# Patient Record
Sex: Male | Born: 1960 | Race: White | Hispanic: No | Marital: Single | Smoking: Current every day smoker
Health system: Southern US, Community
[De-identification: ages and names within clinical notes are randomized; demographics above are authoritative.]

---

## 2014-05-02 ENCOUNTER — Ambulatory Visit: Payer: Self-pay | Admitting: Family Medicine

## 2014-09-07 ENCOUNTER — Ambulatory Visit: Payer: Self-pay | Admitting: Family Medicine

## 2014-09-16 ENCOUNTER — Ambulatory Visit: Payer: Self-pay | Admitting: Family Medicine

## 2014-09-16 ENCOUNTER — Inpatient Hospital Stay: Payer: Self-pay | Admitting: Internal Medicine

## 2014-11-09 NOTE — H&P (Signed)
PATIENT NAME:  Harold Galvan, Harold Galvan MR#:  [id number] DATE OF BIRTH:  28-Feb-1961  DATE OF ADMISSION:  09/16/2014  ADDENDUM:   The patient smokes 1 pack per day and I counseled him again as to quitting, offered assistance and spent about 5 minutes on smoking cessation education.    ____________________________ Katha HammingSnehalatha Tawanna Funk, MD sk:at D: 09/16/2014 17:26:40 ET T: 09/16/2014 17:52:54 ET JOB#: [id number]  cc: Katha HammingSnehalatha Marshea Wisher, MD, <Dictator> Katha HammingSNEHALATHA Denym Rahimi MD ELECTRONICALLY SIGNED 09/16/2014 18:33
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
## 2015-12-27 ENCOUNTER — Emergency Department (HOSPITAL_COMMUNITY)
Admission: EM | Admit: 2015-12-27 | Discharge: 2015-12-27 | Disposition: A | Discharge status: Home / Self Care | Payer: Self-pay | Attending: Emergency Medicine | Admitting: Emergency Medicine

## 2015-12-27 ENCOUNTER — Encounter (HOSPITAL_COMMUNITY): Payer: Self-pay

## 2015-12-27 DIAGNOSIS — F17200 Nicotine dependence, unspecified, uncomplicated: Secondary | ICD-10-CM | POA: Insufficient documentation

## 2015-12-27 DIAGNOSIS — H6593 Unspecified nonsuppurative otitis media, bilateral: Secondary | ICD-10-CM

## 2015-12-27 DIAGNOSIS — K4090 Unilateral inguinal hernia, without obstruction or gangrene, not specified as recurrent: Secondary | ICD-10-CM | POA: Insufficient documentation

## 2015-12-27 DIAGNOSIS — H938X3 Other specified disorders of ear, bilateral: Secondary | ICD-10-CM | POA: Insufficient documentation

## 2015-12-27 MED ORDER — HYDROCODONE-ACETAMINOPHEN 5-325 MG PO TABS: 2.0000 | ORAL_TABLET | ORAL | Status: DC | PRN

## 2015-12-27 MED ORDER — HYDROCODONE-ACETAMINOPHEN 5-325 MG PO TABS
1.0000 | ORAL_TABLET | Freq: Once | ORAL | Status: AC
  Administered 2015-12-27: 1 via ORAL
  Filled 2015-12-27: qty 1

## 2015-12-27 MED ORDER — FLUTICASONE PROPIONATE 50 MCG/ACT NA SUSP: 2.0000 | Freq: Every day | NASAL | Status: DC

## 2015-12-27 MED ORDER — LORATADINE 10 MG PO TABS: 10.0000 mg | ORAL_TABLET | Freq: Every day | ORAL | Status: DC

## 2015-12-27 NOTE — ED Provider Notes (Signed)
CSN: [id number]     Arrival date & time 12/27/15  2001 History   First MD Initiated Contact with Patient 12/27/15 2031     Chief Complaint  Patient presents with  . Inguinal Hernia     (Consider location/radiation/quality/duration/timing/severity/associated sxs/prior Treatment) Patient is a 55 y.o. male presenting with abdominal pain. The history is provided by the patient.  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 Relieved by:  Lying down Exacerbated by: bending, twisting and bearing down. Ineffective treatments:  None tried Associated symptoms: no anorexia, no cough, no dysuria and no vomiting   Risk factors: no alcohol abuse     History reviewed. No pertinent past medical history. History reviewed. No pertinent past surgical history. History reviewed. No pertinent family history. Social History  Substance Use Topics  . Smoking status: Current Every Day Smoker  . Smokeless tobacco: None  . Alcohol Use: 3.6 oz/week    6 Cans of beer per week    Review of Systems  HENT:       Ringing the ears for months and persistent allergies  Respiratory: Negative for cough.   Gastrointestinal: Positive for abdominal pain. Negative for vomiting and anorexia.  Genitourinary: Negative for dysuria.  All other systems reviewed and are negative.     Allergies  Review of patient's allergies indicates not on file.  Home Medications   Prior to Admission medications   Not on File   BP 150/94 mmHg  Pulse 89  Temp(Src) 98 F (36.7 C) (Oral)  Resp 18  SpO2 97% Physical Exam  Constitutional: He is  oriented to person, place, and time. He appears well-developed and well-nourished. No distress.  HENT:  Head: Normocephalic and atraumatic.  Right Ear: A middle ear effusion is present.  Left Ear: A middle ear effusion is present.  Nose: Mucosal edema present.  Mouth/Throat: Oropharynx is clear and moist.  Eyes: Conjunctivae and EOM are normal. Pupils are equal, round, and reactive to light.  Neck: Normal range of motion. Neck supple.  Cardiovascular: Normal rate, regular rhythm and intact distal pulses.   No murmur heard. Pulmonary/Chest: Effort normal and breath sounds normal. No respiratory distress. He has no wheezes. He has no rales.  Abdominal: Soft. He exhibits no distension. There is no tenderness. There is no rebound and no guarding. A hernia is present. Hernia confirmed positive in the right inguinal area.  Genitourinary:     Musculoskeletal: Normal range of motion. He exhibits no edema or tenderness.  Neurological: He is alert and oriented to person, place, and time.  Skin: Skin is warm and dry. No rash noted. No erythema.  Psychiatric: He has a normal mood and affect. His behavior is normal.  Nursing note and vitals reviewed.   ED Course  Procedures (including critical care time) Labs Review Labs Reviewed - No data to display  Imaging Review No results found. I have personally reviewed and evaluated these images and lab results as part of my medical decision-making.   EKG Interpretation None      MDM   Final diagnoses:  Right inguinal hernia  Middle ear  effusion, bilateral    Patient is a 55 year old male presenting today with worsening right inguinal hernia. Patient states he had a hernia years ago but had not had any issues until the last few weeks. He has been doing manual labor and heavy lifting and started noticing severe pain in his right groin. It is worse with lifting bending and twisting. Patient has a reducible right inguinal hernia on exam without  evidence of incarceration. Discussed with him that he needs to avoid heavy lifting and follow-up with surgery for repair. However patient is currently homeless and does not have insurance. He was given information to health and wellness and also at the shelter there is a Child psychotherapist present.  Secondly is had chronic ringing in his ears for several months. He also suffers from allergies. On evaluation he has bilateral TM effusions which is most likely the cause. Recommended allergy medicine.    Gwyneth Sprout, MD 12/27/15 2242

## 2015-12-27 NOTE — ED Notes (Signed)
Pt complaining of R inguinal hernia. Pt states recent lifting and noticed increased swelling. Pt states pain with movement.

## 2015-12-27 NOTE — ED Notes (Signed)
Patient able to ambulate independently  

## 2015-12-27 NOTE — Discharge Instructions (Signed)

## 2016-04-15 ENCOUNTER — Encounter (HOSPITAL_COMMUNITY): Payer: Self-pay | Admitting: Behavioral Health

## 2016-04-15 NOTE — BH Assessment (Signed)
Assessment Note  Harold Galvan is a 55 y.o. male. The following is a copy of the transcript originally completed by this author:  Pt is a 55 year old male hwo presented to [facility] via EMS after an apparent accidental drug overdose.  Pt provided history.  Pt stated on on or about 04/14/16, he experienced an accidental heroin overdose and so was transported to the hospital.  "I'm a drug addict.  I'll use whatever is in front of me, as much as I can."  Pt's UDS indicated the presence of cocaine, amphetamines, and methamphetamines.  UDS not positive for opioids.  Pt endorsed recent use of crack cocaine, but could not recall amounts or age of first use for any drugs.  Pt also endorsed depressive symptoms, including suicidal ideation (hanging self or overdose on 120 tabs of Unisom) if he is unable to get off drugs, hopelessness and worthlessness, and isolation.  Pt endorsed one previous suicide attempt -- an overdose on 60 Unisom pills in December 2016.  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  Pt requested inpatient treatment for substance use and mood.  Pt reported that he was treated inpatient once before -- "It was [facility]."  He could not remember any specific location.  He stated that he does not have a psychiatrist or therapist, and he is not on any psychotropic medication.  During assessment, Pt presented as alert and oriented.  He had good eye contact and was cooperative in session.  Pt's demeanor was calm.  He reported mood as sad, and affect was congruent.  Pt endorsed suicidal ideation with plan and other depressive symptoms (see above).  He also endorsed ongoing use of substances (crack cocaine, amphetamines, methamphetamines, heroin,  alcohol).  Memory and concentration were intact.  Speech was normal in rate, rhythm, and volume.  Thought processes were within normal limits, and thought content was goal-oriented.  Insight and judgment were fair.  Impulse control was poor.  Consulted with L. Earlene Plater, NP, who recommended inpatient treatment.  Per [facility] RN, Pt accepted to [facility] [phone] at 2100.   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  No past surgical history on file.  Family History: No family history on file.  Social History:  reports that he has been smoking.  He does not have any smokeless tobacco history on file. He reports that he drinks about 3.6 oz of alcohol per week . He reports that he uses drugs, including Amphetamines, "Crack" cocaine, Methamphetamines, and Heroin.  Additional Social History:  Alcohol / Drug Use Pain Medications: See PTA Prescriptions: See PTA Over the Counter: See PTA History of alcohol / drug use?: Yes Substance #1 Name of Substance 1: Crack Cocaine 1 - Amount (size/oz): Varied 1 - Frequency: Whenever possible 1 - Duration: Ongoing 1 - Last Use / Amount: 04/14/16 Substance #2 Name of Substance 2: Amphetamines 2 - Amount (size/oz): Varied 2 - Frequency: Whenever possible 2 - Duration: ongoing 2 - Last Use / Amount: 04/14/16 Substance #3 Name of Substance 3: Methamphetamines 3 - Amount (size/oz): Varied 3 - Frequency: Whenever possible 3 - Duration: ongoing 3 - Last Use / Amount: 04/14/16  Substance #4 Name of Substance 4: Heroin (Per report; UDS not + for heroin) 4 - Last Use / Amount: Unsure  CIWA:   COWS:    Allergies: Not on File  Home Medications:  (Not in a hospital admission)  OB/GYN Status:  No LMP for male patient.  General Assessment Data Location of Assessment: [facility] TTS Assessment: Out of system Is  this a Tele or Face-to-Face Assessment?: Tele Assessment Is this an Initial Assessment or a Re-assessment for this encounter?: Initial Assessment Marital status: Divorced Is patient pregnant?: No Pregnancy Status: No Living Arrangements: Alone, Other (Comment), Parent (Homeless) Can pt return to current living arrangement?: Yes Admission Status: Voluntary Is patient capable of signing voluntary admission?: Yes Referral Source: Self/Family/Friend Insurance type: Self-pay  Medical Screening Exam [facility] Walk-in ONLY) Medical Exam completed: Yes  Crisis Care Plan Living Arrangements: Alone, Other (Comment), Parent (Homeless) Name of Psychiatrist: None Name of Therapist: None  Education Status Is patient currently in school?: No  Risk to self with the past 6 months Suicidal Ideation: Yes-Currently Present Has patient been a risk to self within the past 6 months prior to admission? : No Suicidal Intent: No Has patient had any suicidal intent within the past 6 months prior to admission? : No Is patient at risk for suicide?: Yes Suicidal Plan?: Yes-Currently Present Has patient had any suicidal plan within the past 6 months prior to admission? : No Specify Current Suicidal Plan: Hang self; od on 120 Unisom Access to Means: No What has been your use of drugs/alcohol within the last 12 months?: Crack cocaine, methamphetamine, amphetamine (Pt also endorsed heroin) Previous Attempts/Gestures: Yes How many times?: 1 Other Self Harm Risks: Drug use Triggers for Past Attempts: Unknown Intentional Self Injurious Behavior: None Family Suicide History: Unknown Recent stressful life event(s): Other (Comment), Job Loss (Unintentional overdose; laid off from work) Persecutory voices/beliefs?: No Depression: Yes Depression Symptoms: Despondent, Insomnia, Isolating, Feeling worthless/self pity Substance abuse history and/or treatment for substance abuse?: Yes Suicide prevention information given  to non-admitted patients: Not applicable  Risk to Others within the past 6 months Homicidal Ideation: No Does patient have any lifetime risk of violence toward others beyond the six months prior to admission? : No Thoughts of Harm to Others: No Current Homicidal Intent: No Current Homicidal Plan: No Access to Homicidal Means: No History of harm to others?: No Assessment of Violence: None Noted Does patient have access to weapons?: No Criminal Charges Pending?: No Does patient have a court date: No Is patient on probation?: No  Psychosis Hallucinations: None noted Delusions: None noted  Mental Status Report Appearance/Hygiene: In scrubs Eye Contact: Good Motor Activity: Unremarkable Speech: Logical/coherent Level of Consciousness: Alert Mood: Sad, Preoccupied Affect: Appropriate to circumstance Anxiety Level: None Thought Processes: Coherent, Relevant Judgement: Partial Orientation: Person, Situation, Time, Place Obsessive Compulsive Thoughts/Behaviors: None  Cognitive Functioning Concentration: Good Memory: Recent Intact, Remote Intact IQ: Average Insight: Fair Impulse Control: Poor Appetite: Fair Sleep: Decreased Vegetative Symptoms: None  ADLScreening [facility] Assessment Services) Patient's cognitive ability adequate to safely complete daily activities?: Yes Patient able to express need for assistance with ADLs?: Yes Independently performs ADLs?: Yes (appropriate for developmental age)  Prior Inpatient Therapy Prior Inpatient Therapy: Yes Prior Therapy Dates: December 2016 Prior Therapy Facilty/Provider(s): "[facility]" Reason for Treatment: SI  Prior Outpatient Therapy Prior Outpatient Therapy: Yes Prior Therapy Dates: Not sure Does patient have an ACCT team?: No Does patient have Intensive In-House Services?  : No Does patient have [employer]  services? : No Does patient have P4CC services?: No  ADL Screening (condition at time of admission) Patient's  cognitive ability adequate to safely complete daily activities?: Yes Is the patient deaf or have difficulty hearing?: No Does the patient have difficulty seeing, even when wearing glasses/contacts?: No Does the patient have difficulty concentrating, remembering, or making decisions?: No Patient able to express need for assistance with ADLs?: Yes Does the patient have difficulty dressing or bathing?: No Independently performs ADLs?: Yes (appropriate for developmental age) Does the patient have difficulty walking or climbing stairs?: No Weakness of Legs: None Weakness of Arms/Hands: None  Home Assistive Devices/Equipment Home Assistive Devices/Equipment: None  Therapy Consults (therapy consults require a physician order) PT Evaluation Needed: No OT Evalulation Needed: No SLP Evaluation Needed: No Abuse/Neglect Assessment (Assessment to be complete while patient is alone) Physical Abuse: Denies Verbal Abuse: Denies Sexual Abuse: Denies Exploitation of patient/patient's resources: Denies Self-Neglect: Yes, present (Comment) (Substance use) Values / Beliefs Cultural Requests During Hospitalization: None Spiritual Requests During Hospitalization: None Consults Spiritual Care Consult Needed: No Social Work Consult Needed: No Merchant navy officer (For Healthcare) Does patient have an advance directive?: No Would patient like information on creating an advanced directive?: No - patient declined information    Additional Information 1:1 In Past 12 Months?: No CIRT Risk: No Elopement Risk: No     Disposition:  Disposition Initial Assessment Completed for this Encounter: Yes Disposition of Patient: Inpatient treatment program Type of inpatient treatment program: Adult (Per Laban Emperor, NP, Pt meets inpt criteria)  On Site Evaluation by:   Reviewed with Physician:    Dorris Fetch Kurstin Dimarzo 04/15/2016 6:18 PM

## 2016-04-16 ENCOUNTER — Encounter (HOSPITAL_COMMUNITY): Payer: Self-pay | Admitting: *Deleted

## 2016-04-16 ENCOUNTER — Inpatient Hospital Stay (HOSPITAL_COMMUNITY)
Admission: AD | Admit: 2016-04-16 | Discharge: 2016-04-21 | DRG: 885 | Disposition: A | Discharge status: Home / Self Care | Payer: Federal, State, Local not specified - Other | Source: Intra-hospital | Attending: Psychiatry | Admitting: Psychiatry

## 2016-04-16 DIAGNOSIS — F1510 Other stimulant abuse, uncomplicated: Secondary | ICD-10-CM | POA: Diagnosis present

## 2016-04-16 DIAGNOSIS — F17200 Nicotine dependence, unspecified, uncomplicated: Secondary | ICD-10-CM | POA: Diagnosis present

## 2016-04-16 DIAGNOSIS — F332 Major depressive disorder, recurrent severe without psychotic features: Secondary | ICD-10-CM | POA: Diagnosis present

## 2016-04-16 DIAGNOSIS — F1410 Cocaine abuse, uncomplicated: Secondary | ICD-10-CM | POA: Diagnosis present

## 2016-04-16 DIAGNOSIS — F1994 Other psychoactive substance use, unspecified with psychoactive substance-induced mood disorder: Secondary | ICD-10-CM | POA: Diagnosis present

## 2016-04-16 DIAGNOSIS — Z590 Homelessness: Secondary | ICD-10-CM

## 2016-04-16 DIAGNOSIS — R45851 Suicidal ideations: Secondary | ICD-10-CM | POA: Diagnosis present

## 2016-04-16 DIAGNOSIS — M25539 Pain in unspecified wrist: Secondary | ICD-10-CM

## 2016-04-16 DIAGNOSIS — F411 Generalized anxiety disorder: Secondary | ICD-10-CM | POA: Diagnosis present

## 2016-04-16 DIAGNOSIS — F17210 Nicotine dependence, cigarettes, uncomplicated: Secondary | ICD-10-CM | POA: Diagnosis not present

## 2016-04-16 DIAGNOSIS — G4700 Insomnia, unspecified: Secondary | ICD-10-CM | POA: Diagnosis present

## 2016-04-16 DIAGNOSIS — Z79899 Other long term (current) drug therapy: Secondary | ICD-10-CM | POA: Diagnosis not present

## 2016-04-16 DIAGNOSIS — F1490 Cocaine use, unspecified, uncomplicated: Secondary | ICD-10-CM | POA: Diagnosis not present

## 2016-04-16 DIAGNOSIS — Z818 Family history of other mental and behavioral disorders: Secondary | ICD-10-CM

## 2016-04-16 DIAGNOSIS — F1010 Alcohol abuse, uncomplicated: Secondary | ICD-10-CM | POA: Diagnosis present

## 2016-04-16 DIAGNOSIS — Z915 Personal history of self-harm: Secondary | ICD-10-CM | POA: Diagnosis not present

## 2016-04-16 LAB — HEPATIC FUNCTION PANEL
ALT: 43 U/L (ref 17–63)
AST: 26 U/L (ref 15–41)
Albumin: 3.9 g/dL (ref 3.5–5.0)
Alkaline Phosphatase: 71 U/L (ref 38–126)
Bilirubin, Direct: 0.1 mg/dL (ref 0.1–0.5)
Indirect Bilirubin: 0.4 mg/dL (ref 0.3–0.9)
TOTAL PROTEIN: 7.1 g/dL (ref 6.5–8.1)
Total Bilirubin: 0.5 mg/dL (ref 0.3–1.2)

## 2016-04-16 LAB — COMPREHENSIVE METABOLIC PANEL
ALBUMIN: 3.9 g/dL (ref 3.5–5.0)
ALT: 42 U/L (ref 17–63)
ANION GAP: 7 (ref 5–15)
AST: 26 U/L (ref 15–41)
Alkaline Phosphatase: 70 U/L (ref 38–126)
BILIRUBIN TOTAL: 0.5 mg/dL (ref 0.3–1.2)
BUN: 14 mg/dL (ref 6–20)
CO2: 24 mmol/L (ref 22–32)
Calcium: 9.1 mg/dL (ref 8.9–10.3)
Chloride: 107 mmol/L (ref 101–111)
Creatinine, Ser: 0.88 mg/dL (ref 0.61–1.24)
GFR calc Af Amer: >60 mL/min (ref >60)
GLUCOSE: 157 mg/dL — AB (ref 65–99)
POTASSIUM: 4 mmol/L (ref 3.5–5.1)
Sodium: 138 mmol/L (ref 135–145)
TOTAL PROTEIN: 7 g/dL (ref 6.5–8.1)

## 2016-04-16 LAB — LIPID PANEL
CHOL/HDL RATIO: 5.3 ratio
Cholesterol: 171 mg/dL (ref 0–200)
HDL: 32 mg/dL — AB (ref >40)
LDL CALC: 111 mg/dL — AB (ref 0–99)
Triglycerides: 142 mg/dL (ref <150)
VLDL: 28 mg/dL (ref 0–40)

## 2016-04-16 MED ORDER — ADULT MULTIVITAMIN W/MINERALS CH
1.0000 | ORAL_TABLET | Freq: Every day | ORAL | Status: DC
  Administered 2016-04-16 – 2016-04-20 (×5): 1 via ORAL
  Filled 2016-04-16 (×9): qty 1

## 2016-04-16 MED ORDER — HYDROXYZINE HCL 25 MG PO TABS: 25.0000 mg | ORAL_TABLET | Freq: Three times a day (TID) | ORAL | Status: DC | PRN

## 2016-04-16 MED ORDER — HYDROXYZINE HCL 25 MG PO TABS: 25.0000 mg | ORAL_TABLET | Freq: Four times a day (QID) | ORAL | Status: AC | PRN

## 2016-04-16 MED ORDER — LOPERAMIDE HCL 2 MG PO CAPS: 2.0000 mg | ORAL_CAPSULE | ORAL | Status: AC | PRN

## 2016-04-16 MED ORDER — NICOTINE 21 MG/24HR TD PT24
21.0000 mg | MEDICATED_PATCH | Freq: Every day | TRANSDERMAL | Status: DC
  Administered 2016-04-16 – 2016-04-21 (×6): 21 mg via TRANSDERMAL
  Filled 2016-04-16 (×8): qty 1

## 2016-04-16 MED ORDER — MAGNESIUM HYDROXIDE 400 MG/5ML PO SUSP
30.0000 mL | Freq: Every day | ORAL | Status: DC | PRN
  Administered 2016-04-18 – 2016-04-19 (×2): 30 mL via ORAL
  Filled 2016-04-16 (×2): qty 30

## 2016-04-16 MED ORDER — LORAZEPAM 1 MG PO TABS
1.0000 mg | ORAL_TABLET | Freq: Four times a day (QID) | ORAL | Status: AC | PRN
  Administered 2016-04-16: 1 mg via ORAL
  Filled 2016-04-16: qty 1

## 2016-04-16 MED ORDER — THIAMINE HCL 100 MG/ML IJ SOLN
100.0000 mg | Freq: Once | INTRAMUSCULAR | Status: AC
  Administered 2016-04-16: 100 mg via INTRAMUSCULAR

## 2016-04-16 MED ORDER — TRAZODONE HCL 50 MG PO TABS
50.0000 mg | ORAL_TABLET | Freq: Every evening | ORAL | Status: DC | PRN
  Administered 2016-04-16 – 2016-04-18 (×5): 50 mg via ORAL
  Filled 2016-04-16 (×10): qty 1

## 2016-04-16 MED ORDER — VITAMIN B-1 100 MG PO TABS
100.0000 mg | ORAL_TABLET | Freq: Every day | ORAL | Status: DC
  Administered 2016-04-17 – 2016-04-20 (×4): 100 mg via ORAL
  Filled 2016-04-16 (×7): qty 1

## 2016-04-16 MED ORDER — ALUM & MAG HYDROXIDE-SIMETH 200-200-20 MG/5ML PO SUSP: 30.0000 mL | ORAL | Status: DC | PRN

## 2016-04-16 MED ORDER — ONDANSETRON 4 MG PO TBDP: 4.0000 mg | ORAL_TABLET | Freq: Four times a day (QID) | ORAL | Status: AC | PRN

## 2016-04-16 MED ORDER — ACETAMINOPHEN 325 MG PO TABS
650.0000 mg | ORAL_TABLET | Freq: Four times a day (QID) | ORAL | Status: DC | PRN
  Administered 2016-04-17: 650 mg via ORAL
  Filled 2016-04-16: qty 2

## 2016-04-16 NOTE — [facility] Suicide Risk Assessment (Addendum)
[facility]   Nursing information obtained from:  Patient Demographic factors:  Male, Divorced or widowed, Caucasian, Cardell PeachGay, lesbian, or bisexual orientation, Low socioeconomic status, Living alone, Unemployed Current Mental Status:  Suicidal ideation indicated by patient, Suicide plan, Self-harm thoughts, Self-harm behaviors Loss Factors:  Decrease in vocational status, Loss of significant relationship, Decline in physical health, Financial problems / change in socioeconomic status Historical Factors:  Prior suicide attempts, Impulsivity Risk Reduction Factors:  NA  Total Time spent with patient: 45 minutes Principal Problem: Substance induced mood disorder (HCC) Diagnosis:   Patient Active Problem List   Diagnosis Date Noted  . Substance induced mood disorder (HCC) [F19.94] 04/16/2016    Continued Clinical Symptoms:  Alcohol Use Disorder Identification Test Final Score (AUDIT): 10 The "Alcohol Use Disorders Identification Test", Guidelines for Use in Primary Care, Second Edition.  World Science writerHealth Organization [facility](WHO). Score between 0-7:  no or low risk or alcohol related problems. Score between 8-15:  moderate risk of alcohol related problems. Score between 16-19:  high risk of alcohol related problems. Score 20 or above:  warrants further diagnostic evaluation for alcohol dependence and treatment.   CLINICAL FACTORS:  Patient is a 55 year old man, who states he accidentally overdosed on heroin . He denies any suicidal intention related to his overdose, but on assessment did describe depression and suicidal ideations. States he has been living in an apartment, area where there are drugs readily available, and that over recent weeks he has been abusing drugs regularly. States that heroin/opiates are not his substance of choice , and that he had used heroin only 1-2 times before. He does report history of polysubstance abuse , and describes history of alcohol, cocaine,  and amphetamine abuse . He had been drinking 5-6 beers a day , although not daily  . At this time not presenting with any alcohol withdrawal symptoms. He states recent months, particularly after being laid off from work and breaking up with his SO, have been " very tough", and that his substance abuse has increased . At this time patient is not presenting with significant opiate WDL symptoms. He reports feeling depressed, but denies current suicidal ideations  Dx- Polysubstance Abuse, Substance Induced Mood Disorder, versus MDD  Plan - Inpatient admission , Vistaril PRNs for anxiety, Trazodone PRNs for insomnia. Monitor mood and consider antidepressant medication if depression does not improve promptly with sobriety, abstinence. Although not currently presenting with any alcohol WDL , will order Ativan PRNs for potential emerging WDL symptoms. At this time patient is future oriented, focuses on disposition planning, states he cannot return to place where he was living, expresses interest in rehab settings   Musculoskeletal: Strength & Muscle Tone: within normal limits- no tremors, no diaphoresis, no psychomotor agitation noted  Gait & Station: normal Patient leans: N/A  Psychiatric Specialty Exam: Physical Exam  ROS denies headache, no chest pain, no shortness of breath, no vomiting   Blood pressure 98/60, pulse 75, temperature 97.6 F (36.4 C), resp. rate 20, height 5' 4.17" (1.63 m), weight 145 lb (65.8 kg), SpO2 97 %.Body mass index is 24.76 kg/m.  General Appearance: Fairly Groomed  Eye Contact:  Good  Speech:  Normal Rate  Volume:  Normal  Mood:  reports some depression , but states feeling better today  Affect:  vaguely anxious, reactive   Thought Process:  Linear  Orientation:  Other:  fully alert, attentive   Thought Content:  no hallucinations, no delusions, not internally preoccupied  Suicidal Thoughts:  No denies any suicidal or self injurious ideations at this time, and  contracts for safety on the unit  Homicidal Thoughts:  No denies any homicidal ideations   Memory:  recent and remote grossly intact   Judgement:  Fair  Insight:  Fair  Psychomotor Activity:  Normal- no current psychomotor agitation or restlessness   Concentration:  Concentration: Good and Attention Span: Good  Recall:  Good  Fund of Knowledge:  Good  Language:  Good  Akathisia:  Negative  Handed:  Right  AIMS (if indicated):     Assets:  Desire for Improvement Resilience  ADL's:  Intact  Cognition:  WNL  Sleep:  Number of Hours: 3.5      COGNITIVE FEATURES THAT CONTRIBUTE TO RISK:  Closed-mindedness and Loss of executive function    SUICIDE RISK:   Moderate:  Frequent suicidal ideation with limited intensity, and duration, some specificity in terms of plans, no associated intent, good self-control, limited dysphoria/symptomatology, some risk factors present, and identifiable protective factors, including available and accessible social support.   PLAN OF CARE: Patient will be admitted to inpatient psychiatric unit for stabilization and safety. Will provide and encourage milieu participation. Provide medication management and maked adjustments as needed.  Will follow daily.    I certify that inpatient services furnished can reasonably be expected to improve the patient's condition.  Nehemiah Massed, MD 04/16/2016, 5:20 PM

## 2016-04-16 NOTE — H&P (Signed)
Psychiatric Admission Assessment Adult  Patient Identification: Harold Galvan MRN:  [id number] Date of Evaluation:  04/16/2016 Chief Complaint:  MDD Severe Polysubstance Abuse Principal Diagnosis: Substance induced mood disorder ([place]) Diagnosis:   Patient Active Problem List   Diagnosis Date Noted  . Substance induced mood disorder [facility]) [F19.94] 04/16/2016   History of Present Illness:Per assessment note- Harold Galvan is a 55 y.o. male. The following is a copy of the transcript originally completed by this author: Pt is a 55 year old male hwo presented to [facility] via EMS after an apparent accidental drug overdose.  Pt provided history.  Pt stated on on or about 04/14/16, he experienced an accidental heroin overdose and so was transported to the hospital.  "I'm a drug addict.  I'll use whatever is in front of me, as much as I can."  Pt's UDS indicated the presence of cocaine, amphetamines, and methamphetamines.  UDS not positive for opioids.  Pt endorsed recent use of crack cocaine, but could not recall amounts or age of first use for any drugs.  Pt also endorsed depressive symptoms, including suicidal ideation (hanging self or overdose on 120 tabs of Unisom) if he is unable to get off drugs, hopelessness and worthlessness, and isolation.  Pt endorsed one previous suicide attempt -- an overdose on 70 Unisom pills in December 2016.  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  Pt requested inpatient treatment for substance use and mood. Pt reported that he was treated inpatient once before -- "It was [facility]."  He could not remember any specific location.  He stated that he does not have a psychiatrist or therapist, and he is not  on any psychotropic medication. During assessment, Pt presented as alert and oriented.  He had good eye contact and was cooperative in session.  Pt's demeanor was calm.  He reported mood as sad, and affect was congruent.  Pt endorsed suicidal ideation with plan and other depressive symptoms (see above).  He also endorsed ongoing use of substances (crack cocaine, amphetamines, methamphetamines, heroin, alcohol).  Memory and concentration were intact.  Speech was normal in rate, rhythm, and volume.  Thought processes were within normal limits, and thought content was goal-oriented.  Insight and judgment were fair.  Impulse control was poor.  Associated Signs/Symptoms: Depression Symptoms:  depressed mood, fatigue, suicidal attempt, (Hypo) Manic Symptoms:  Distractibility, Irritable Mood, Anxiety Symptoms:  Excessive Worry, Psychotic Symptoms:  Hallucinations: None PTSD Symptoms: Avoidance:  Decreased Interest/Participation Total Time spent with patient: 45 minutes  Past Psychiatric History: See above  Is the patient at risk to self? Yes.    Has the patient been a risk to self in the past 6 months? Yes.    Has the patient been a risk to self within the distant past? Yes.    Is the patient a risk to others? Yes.    Has the patient been a risk to others in the past 6 months? No.  Has the patient been a risk to others within the distant past? No.   Prior Inpatient Therapy: Prior Inpatient Therapy: Yes Prior Therapy Dates: December 2016 Prior Therapy Facilty/Provider(s): "[facility]" Reason for Treatment: SI Prior Outpatient Therapy: Prior Outpatient Therapy: Yes Prior Therapy Dates: Not sure Does patient have  an ACCT team?: No Does patient have Intensive In-House Services?  : No Does patient have [facility] services? : No Does patient have P4CC services?: No  Alcohol Screening: 1. How often do you have a drink containing alcohol?: 4 or more times a week 2. How many drinks containing  alcohol do you have on a typical day when you are drinking?: 1 or 2 3. How often do you have six or more drinks on one occasion?: Weekly Preliminary Score: 3 4. How often during the last year have you found that you were not able to stop drinking once you had started?: Never 5. How often during the last year have you failed to do what was normally expected from you becasue of drinking?: Never 6. How often during the last year have you needed a first drink in the morning to get yourself going after a heavy drinking session?: Never 7. How often during the last year have you had a feeling of guilt of remorse after drinking?: Never 8. How often during the last year have you been unable to remember what happened the night before because you had been drinking?: Less than monthly 9. Have you or someone else been injured as a result of your drinking?: No 10. Has a relative or friend or a doctor or another health worker been concerned about your drinking or suggested you cut down?: Yes, but not in the last year Alcohol Use Disorder Identification Test Final Score (AUDIT): 10 Brief Intervention: Yes Substance Abuse History in the last 12 months:  Yes.   Consequences of Substance Abuse: Withdrawal Symptoms:   Cramps Headaches Previous Psychotropic Medications: YES Psychological Evaluations: YES Past Medical History: History reviewed. No pertinent past medical history. History reviewed. No pertinent surgical history. Family History: History reviewed. No pertinent family history. Family Psychiatric  History: See Above Tobacco Screening: Have you used any form of tobacco in the last 30 days? (Cigarettes, Smokeless Tobacco, Cigars, and/or Pipes): Yes Tobacco use, Select all that apply: 5 or more cigarettes per day Are you interested in Tobacco Cessation Medications?: Yes, will notify MD for an order Counseled patient on smoking cessation including recognizing danger situations, developing coping skills and  basic information about quitting provided: Yes Social History:  History  Alcohol Use  . 3.6 oz/week  . 6 Cans of beer per week     History  Drug Use  . Types: Amphetamines, "Crack" cocaine, Methamphetamines, Heroin    Comment: Heroin use per report; UDS + coc, meth, amph    Additional Social History: Marital status: Divorced Are you sexually active?: No What is your sexual orientation?: heterosexual Has your sexual activity been affected by drugs, alcohol, medication, or emotional stress?: "probably" Does patient have children?: Yes How many children?: 1 How is patient's relationship with their children?: Estranged from 70 YO daughter due to pt's current lifestyle choices    Pain Medications: See PTA Prescriptions: See PTA Over the Counter: See PTA History of alcohol / drug use?: Yes Name of Substance 1: Crack Cocaine 1 - Amount (size/oz): Varied 1 - Frequency: Whenever possible 1 - Duration: Ongoing 1 - Last Use / Amount: 04/14/16 Name of Substance 2: Amphetamines 2 - Amount (size/oz): Varied 2 - Frequency: Whenever possible 2 - Duration: ongoing 2 - Last Use / Amount: 04/14/16 Name of Substance 3: Methamphetamines 3 - Amount (size/oz): Varied 3 - Frequency: Whenever possible 3 - Duration: ongoing 3 - Last Use / Amount: 04/14/16 Name of Substance 4: Heroin (Per report; UDS  not + for heroin) 4 - Last Use / Amount: Unsure            Allergies:  No Known Allergies Lab Results:  Results for orders placed or performed during the hospital encounter of 04/16/16 (from the past 48 hour(s))  Comprehensive metabolic panel     Status: Abnormal   Collection Time: 04/16/16  6:43 AM  Result Value Ref Range   Sodium 138 135 - 145 mmol/L   Potassium 4.0 3.5 - 5.1 mmol/L   Chloride 107 101 - 111 mmol/L   CO2 24 22 - 32 mmol/L   Glucose, Bld 157 (H) 65 - 99 mg/dL   BUN 14 6 - 20 mg/dL   Creatinine, Ser 0.88 0.61 - 1.24 mg/dL   Calcium 9.1 8.9 - 10.3 mg/dL   Total Protein 7.0  6.5 - 8.1 g/dL   Albumin 3.9 3.5 - 5.0 g/dL   AST 26 15 - 41 U/L   ALT 42 17 - 63 U/L   Alkaline Phosphatase 70 38 - 126 U/L   Total Bilirubin 0.5 0.3 - 1.2 mg/dL   GFR calc non Af Amer >60 >60 mL/min   GFR calc Af Amer >60 >60 mL/min    Comment: (NOTE) The eGFR has been calculated using the CKD EPI equation. This calculation has not been validated in all clinical situations. eGFR's persistently <60 mL/min signify possible Chronic Kidney Disease.    Anion gap 7 5 - 15    Comment: Performed at [facility]  Hepatic function panel     Status: None   Collection Time: 04/16/16  6:43 AM  Result Value Ref Range   Total Protein 7.1 6.5 - 8.1 g/dL   Albumin 3.9 3.5 - 5.0 g/dL   AST 26 15 - 41 U/L   ALT 43 17 - 63 U/L   Alkaline Phosphatase 71 38 - 126 U/L   Total Bilirubin 0.5 0.3 - 1.2 mg/dL   Bilirubin, Direct 0.1 0.1 - 0.5 mg/dL   Indirect Bilirubin 0.4 0.3 - 0.9 mg/dL    Comment: Performed at [facility]  Lipid panel     Status: Abnormal   Collection Time: 04/16/16  6:43 AM  Result Value Ref Range   Cholesterol 171 0 - 200 mg/dL   Triglycerides 142 <150 mg/dL   HDL 32 (L) >40 mg/dL   Total CHOL/HDL Ratio 5.3 RATIO   VLDL 28 0 - 40 mg/dL   LDL Cholesterol 111 (H) 0 - 99 mg/dL    Comment:        Total Cholesterol/HDL:CHD Risk Coronary Heart Disease Risk Table                     Men   Women  1/2 Average Risk   3.4   3.3  Average Risk       5.0   4.4  2 X Average Risk   9.6   7.1  3 X Average Risk  23.4   11.0        Use the calculated Patient Ratio above and the CHD Risk Table to determine the patient's CHD Risk.        ATP III CLASSIFICATION (LDL):  <100     mg/dL   Optimal  100-129  mg/dL   Near or Above                    Optimal  130-159  mg/dL   Borderline  160-189  mg/dL   High  >190     mg/dL   Very High Performed at [facility]     Blood Alcohol level:  No results found for: [facility]  Metabolic Disorder Labs:   No results found for: HGBA1C, MPG No results found for: PROLACTIN Lab Results  Component Value Date   CHOL 171 04/16/2016   TRIG 142 04/16/2016   HDL 32 (L) 04/16/2016   CHOLHDL 5.3 04/16/2016   VLDL 28 04/16/2016   LDLCALC 111 (H) 04/16/2016    Current Medications: Current Facility-Administered Medications  Medication Dose Route Frequency Provider Last Rate Last Dose  . acetaminophen (TYLENOL) tablet 650 mg  650 mg Oral Q6H PRN Rozetta Nunnery, NP      . alum & mag hydroxide-simeth (MAALOX/MYLANTA) 200-200-20 MG/5ML suspension 30 mL  30 mL Oral Q4H PRN Rozetta Nunnery, NP      . hydrOXYzine (ATARAX/VISTARIL) tablet 25 mg  25 mg Oral TID PRN Rozetta Nunnery, NP      . magnesium hydroxide (MILK OF MAGNESIA) suspension 30 mL  30 mL Oral Daily PRN Rozetta Nunnery, NP      . nicotine (NICODERM CQ - dosed in mg/24 hours) patch 21 mg  21 mg Transdermal Daily Rozetta Nunnery, NP   21 mg at 04/16/16 0911  . traZODone (DESYREL) tablet 50 mg  50 mg Oral QHS,MR X 1 Rozetta Nunnery, NP   50 mg at 04/16/16 0153   PTA Medications: Prescriptions Prior to Admission  Medication Sig Dispense Refill Last Dose  . acetaminophen (TYLENOL) 325 MG tablet Take 650 mg by mouth every 6 (six) hours as needed for mild pain or headache.     . fluticasone (FLONASE) 50 MCG/ACT nasal spray Place 2 sprays into both nostrils daily. 16 g 2   . HYDROcodone-acetaminophen (NORCO/VICODIN) 5-325 MG tablet Take 2 tablets by mouth every 4 (four) hours as needed. 6 tablet 0   . loratadine (CLARITIN) 10 MG tablet Take 1 tablet (10 mg total) by mouth daily. 30 tablet 1     Musculoskeletal: Strength & Muscle Tone: within normal limits Gait & Station: normal Patient leans: N/A  Psychiatric Specialty Exam: Physical Exam  Nursing note and vitals reviewed. Constitutional: He is oriented to person, place, and time. He appears well-developed and well-nourished.  Neurological: He is alert and oriented to person, place, and time.   Psychiatric: He has a normal mood and affect. His behavior is normal.    Review of Systems  Psychiatric/Behavioral: Positive for depression and substance abuse. The patient is nervous/anxious.     Blood pressure 98/60, pulse 75, temperature 97.6 F (36.4 C), resp. rate 20, height 5' 4.17" (1.63 m), weight 65.8 kg (145 lb), SpO2 97 %.Body mass index is 24.76 kg/m.  General Appearance: Disheveled  Eye Contact:  Minimal  Speech:  Clear and Coherent  Volume:  Normal  Mood:  Anxious and Depressed  Affect:  Depressed and Flat  Thought Process:  Coherent  Orientation:  Full (Time, Place, and Person)  Thought Content:  Hallucinations: None  Suicidal Thoughts:  No  Homicidal Thoughts:  No  Memory:  Immediate;   Fair Recent;   Fair Remote;   Fair  Judgement:  Intact  Insight:  Lacking  Psychomotor Activity:  Restlessness  Concentration:  Concentration: Good  Recall:  [place] of Knowledge:  Fair  Language:  Poor  Akathisia:  No  Handed:  Right  AIMS (if indicated):  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  Treatment Plan Summary: Daily contact with patient to assess and evaluate symptoms and progress in treatment and Medication management   Continue with for mood stabilization. Continue with Trazodone 50 mg for insomnia  Will continue to monitor vitals ,medication compliance and treatment side effects while patient is here.  Reviewed labs Glucose 157 elevated ,BAL - 0, UDS -. CSW will start working on disposition.  Patient to participate in therapeutic milieu  Observation Level/Precautions:  15 minute checks  Laboratory:   CBC Chemistry Profile UDS UA  Psychotherapy:  Individual and group session  Medications:  See above  Consultations:  Psychiatry   Discharge Concerns:  Safety, stabilization, and risk of access to medication and medication stabilization   Estimated LOS:5-7days  Other:     Physician Treatment Plan for Primary Diagnosis: Substance induced mood disorder ([place]) Long Term Goal(s): Improvement in symptoms so as ready for discharge  Short Term Goals: Ability to identify changes in lifestyle to reduce recurrence of condition will improve, Ability to demonstrate self-control will improve, Ability to maintain clinical measurements within normal limits will improve and Ability to identify triggers associated with substance abuse/mental health issues will improve  Physician Treatment Plan for Secondary Diagnosis: Principal Problem:   Substance induced mood disorder ([place])  Long Term Goal(s): Improvement in symptoms so as ready for discharge  Short Term Goals: Ability to verbalize feelings will improve, Ability to demonstrate self-control will improve, Ability to maintain clinical measurements within normal limits will improve, Compliance with prescribed medications will improve and Ability to identify triggers associated with substance abuse/mental health issues will improve  I certify that inpatient services furnished can reasonably be expected to improve the patient's condition.    Derrill Center, NP 10/7/20173:23 PM   I have reviewed case with NP and have met with patient  Agree with NP assessment  Patient is a 55 year old man, who states he accidentally overdosed on heroin . He denies any suicidal intention related to his overdose, but on assessment did describe depression and suicidal ideations. States he has been living in an apartment, area where there are drugs readily available, and that over recent weeks he has been abusing drugs regularly. States that heroin/opiates are not his substance of  choice , and that he had used heroin only 1-2 times before. He does report history of polysubstance abuse , and describes history of alcohol, cocaine, and amphetamine abuse . He had been drinking 5-6 beers a day , although not daily  . At this time not presenting with any alcohol withdrawal symptoms. He states recent months, particularly after being laid off from work and breaking up with his SO, have been " very tough", and that his substance abuse has increased . At this time patient is not presenting with significant opiate WDL symptoms. He reports feeling depressed, but denies current suicidal ideations  Dx- Polysubstance Abuse, Substance Induced Mood Disorder, versus MDD  Plan - Inpatient admission , Vistaril PRNs for anxiety, Trazodone PRNs for insomnia. Monitor mood and consider antidepressant medication if depression does not improve promptly with sobriety, abstinence. Although not currently presenting with any alcohol WDL ,  will order Ativan PRNs for potential emerging WDL symptoms. At this time patient is future oriented, focuses on disposition planning, states he cannot return to place where he was living, expresses interest in rehab settings

## 2016-04-16 NOTE — Progress Notes (Addendum)
Patient ID: Graciella BeltonKevin Arthur Matos, male   DOB: January 02, 1961, 55 y.o.   MRN: [id number]    D: Pt has been very flat and depressed on the unit today. Pt was also very isolative, he remained in the bed most of the day. Pt reported on his self inventory sheet that his depression was a 5, his hopelessness was a 5, and his anxiety was a 6. Pt reported being negative SI/HI, no AH/VH noted. A: 15 min checks continued for patient safety. R: Pt safety maintained.

## 2016-04-16 NOTE — Progress Notes (Signed)
Pt was invited to attend group; however, pt was in bed sleeping. 
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
## 2016-04-16 NOTE — B[facility] Suicide Risk Assessment (Signed)
[facility] INPATIENT:  Family/Significant Other Suicide Prevention Education  Suicide Prevention Education:  Patient Refusal for Family/Significant Other Suicide Prevention Education: The patient Harold Galvan has refused to provide written consent for family/significant other to be provided Family/Significant Other Suicide Prevention Education during admission and/or prior to discharge.  Physician notified.  Writer provided suicide prevention education directly to patient; conversation included risk factors, warning signs and resources to contact for help. Mobile crisis services explained and  explanations given as to resources which will be included on patient's discharge paperwork.   [name] 04/16/2016, 2:00 PM

## 2016-04-16 NOTE — [facility] Group Notes (Signed)
[facility] Group Notes: (Clinical Social Work)   04/16/2016      Type of Therapy:  Group Therapy   Participation Level:  Did Not Attend despite MHT prompting   Alisha Bacus Grossman-Orr, LCSW 04/16/2016, 11:07 AM     

## 2016-04-16 NOTE — Progress Notes (Signed)
D: Pt denies SI/HI/AVH. Pt is pleasant and cooperative. Pt goal for today is to have positive thoughts. A: Pt was offered support and encouragement. Pt was given scheduled medications. Pt was encourage to attend groups. Q 15 minute checks were done for safety.  R: Pt is taking medication. Pt has no complaints.Pt receptive to treatment and safety maintained on unit.

## 2016-04-16 NOTE — Tx Team (Signed)
Initial Treatment Plan 04/16/2016 1:08 AM Harold BeltonKevin Arthur Patalano ZOX:[id number]    PATIENT STRESSORS: Financial difficulties Health problems Loss of vital relationships Marital or family conflict Occupational concerns Substance abuse   PATIENT STRENGTHS: Ability for insight Average or above average intelligence Communication skills Motivation for treatment/growth   PATIENT IDENTIFIED PROBLEMS: At risk for suicide  Substance Abuse  "Get off the drugs"  "Find stable situation, get back to work, get back on my feet"               DISCHARGE CRITERIA:  Ability to meet basic life and health needs Adequate post-discharge living arrangements Improved stabilization in mood, thinking, and/or behavior Medical problems require only outpatient monitoring Motivation to continue treatment in a less acute level of care Need for constant or close observation no longer present Withdrawal symptoms are absent or subacute and managed without 24-hour nursing intervention  PRELIMINARY DISCHARGE PLAN: Attend 12-step recovery group Outpatient therapy Placement in alternative living arrangements  PATIENT/FAMILY INVOLVEMENT: This treatment plan has been presented to and reviewed with the patient, Harold Galvan.  The patient and family have been given the opportunity to ask questions and make suggestions.  Carleene OverlieMiddleton, [name], RN 04/16/2016, 1:08 AM

## 2016-04-16 NOTE — Progress Notes (Signed)
Admission Note:  55 year old male who presents voluntary, in no acute distress, for the treatment of SI, Depression, and Substance Abuse. Patient reports an accidental overdose of Heroin prior to admission.  Patient appears flat and depressed. Patient was calm and cooperative with admission process. Patient presents with passive SI and contracts for safety upon admission. Patient denies AVH. Patient reports multiple stressors to include a breakup with his fiance in October of 2016, the lost of a relationship with his daughter and grandchildren, and the recent loss of a job.  Patient reports that he was previously living on a friends couch and reports being surrounded by drugs. Patient states "I was going to end up dead".  Patient verbalizes that he will not return to friends home on discharge and is looking for placement at a halfway house or rehab facility.  Patient states "If I can't get housing, I'll sleep underneath a bridge".  Patient reports prior suicide attempt in December 2016 by overdosing on 60 Unisom pills.  Patient reports SI with plan to overdose on 120 Unisom pills or hanging himself.  Patient reports drug use of "meth, crack cocaine, and heroin or whatever's around".  [facility], patient's plan is to "get off the drugs" and "find stable situation, get back to work, and get back on my feet".  Skin was assessed and found to be clear of any abnormal marks apart from redness on left arm and right hand. Patient searched and no contraband found, POC and unit policies explained and understanding verbalized. Consents obtained. Food and fluids offered and accepted. Patient had no additional questions or concerns.

## 2016-04-16 NOTE — Progress Notes (Signed)
[name] is seen OOB UAL on the 300 hall today..he tolerates this sell. He completes his daily assessment and on it he writes he denies SI today and he rates his depression, hopelessness and anxiety "5/5/6", respectively. He takes his meds as planned, he interacts appropriately with other patients and he has attended his mornign group with his Child psychotherapistsocial worker. R Safety is in place and will continue to foster therapeutic relationship.

## 2016-04-17 DIAGNOSIS — F332 Major depressive disorder, recurrent severe without psychotic features: Secondary | ICD-10-CM | POA: Diagnosis present

## 2016-04-17 DIAGNOSIS — F17210 Nicotine dependence, cigarettes, uncomplicated: Secondary | ICD-10-CM

## 2016-04-17 NOTE — Progress Notes (Signed)
Patient did not attend the evening speaker AA meeting. Pt was notified that group was beginning but remained in bed.   

## 2016-04-17 NOTE — [facility] Group Notes (Signed)
The focus of this group is to educate the patient on the purpose and policies of crisis stabilization and provide a format to answer questions about their admission.  The group details unit policies and expectations of patients while admitted.  Patient did not attend 0900 nurse education orientation group this morning.  Patient stayed in bed.   

## 2016-04-17 NOTE — B[facility] Group Notes (Signed)
[facility] Group Notes: (Clinical Social Work)   04/17/2016      Type of Therapy:  Group Therapy   Participation Level:  Did Not Attend despite MHT prompting   Kishon Garriga Grossman-Orr, LCSW 04/17/2016, 12:23 PM     

## 2016-04-17 NOTE — Plan of Care (Signed)
Problem: Safety: Goal: Periods of time without injury will increase Outcome: Progressing Patient has not engaged in self harm, denies SI.  Problem: Medication: Goal: Compliance with prescribed medication regimen will improve Outcome: Progressing Patient is med compliant.   

## 2016-04-17 NOTE — Progress Notes (Signed)
[facility] MD Progress Note  04/17/2016 2:11 PM Harold Galvan  MRN:  [id number] Subjective:  "I'm feeling a little bit better today, but still feeling bad overall"  Objective: Pt seen and chart reviewed. Pt is alert/oriented x4, calm, cooperative, and appropriate to situation. Pt denies suicidal/homicidal ideation and psychosis and does not appear to be responding to internal stimuli. Pt states that he slept poorly but slightly better than he did at home. 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 Pt feels some benefit from medications thus far and would like to continue the plan from yesterday to give the medications time to work.  Pt is considering the implementation of an antidepressant but would like to think about this and consult with [place] on a daily basis to continually evaluate this. Advised pt that he would benefit from such.   Principal Problem: MDD (major depressive disorder), recurrent severe, without psychosis ([place]) Diagnosis:   Patient Active Problem List   Diagnosis Date Noted  . MDD (major depressive disorder), recurrent severe, without psychosis ([place]) [F33.2] 04/17/2016    Priority: High  . Substance induced mood disorder [facility]) [F19.94] 04/16/2016    Priority: High   Total Time spent with patient: 25 minutes  Past Psychiatric History: ETOH, MDD< polysubstance abuse  Past Medical History: History reviewed. No pertinent past medical history. History reviewed. No pertinent surgical history. Family History: History reviewed. No pertinent family history. Family Psychiatric  History: MDD Social History:  History  Alcohol Use  . 3.6 oz/week  . 6 Cans of beer per week     History  Drug Use  . Types: Amphetamines, "Crack" cocaine, Methamphetamines, Heroin    Comment: Heroin use per report; UDS + coc, meth, amph     Social History   Social History  . Marital status: Single    Spouse name: N/A  . Number of children: N/A  . Years of education: N/A   Social History Main Topics  . Smoking status: Current Every Day Smoker  . Smokeless tobacco: Never Used  . Alcohol use 3.6 oz/week    6 Cans of beer per week  . Drug use:     Types: Amphetamines, "Crack" cocaine, Methamphetamines, Heroin     Comment: Heroin use per report; UDS + coc, meth, amph  . Sexual activity: Not Currently   Other Topics Concern  . None   Social History Narrative  . None   Additional Social History:    Pain Medications: See PTA Prescriptions: See PTA Over the Counter: See PTA History of alcohol / drug use?: Yes Name of Substance 1: Crack Cocaine 1 - Amount (size/oz): Varied 1 - Frequency: Whenever possible 1 - Duration: Ongoing 1 - Last Use / Amount: 04/14/16 Name of Substance 2: Amphetamines 2 - Amount (size/oz): Varied 2 - Frequency: Whenever possible 2 - Duration: ongoing 2 - Last Use / Amount: 04/14/16 Name of Substance 3: Methamphetamines 3 - Amount (size/oz): Varied 3 - Frequency: Whenever possible 3 - Duration: ongoing 3 - Last Use / Amount: 04/14/16 Name of Substance 4: Heroin (Per report; UDS not + for heroin) 4 - Last Use / Amount: Unsure            Sleep: Fair  Appetite:  Fair  Current Medications: Current Facility-Administered Medications  Medication Dose Route Frequency Provider Last Rate Last Dose  . acetaminophen (TYLENOL)  tablet 650 mg  650 mg Oral Q6H PRN Rozetta Nunnery, NP      . alum & mag hydroxide-simeth (MAALOX/MYLANTA) 200-200-20 MG/5ML suspension 30 mL  30 mL Oral Q4H PRN Rozetta Nunnery, NP      . hydrOXYzine (ATARAX/VISTARIL) tablet 25 mg  25 mg Oral Q6H PRN Jenne Campus, MD      . loperamide (IMODIUM) capsule 2-4 mg  2-4 mg Oral PRN Jenne Campus, MD      . LORazepam (ATIVAN) tablet 1 mg  1 mg Oral Q6H PRN Jenne Campus, MD   1 mg at 04/16/16 1818  . magnesium  hydroxide (MILK OF MAGNESIA) suspension 30 mL  30 mL Oral Daily PRN Rozetta Nunnery, NP      . multivitamin with minerals tablet 1 tablet  1 tablet Oral Daily Jenne Campus, MD   1 tablet at 04/17/16 0827  . nicotine (NICODERM CQ - dosed in mg/24 hours) patch 21 mg  21 mg Transdermal Daily Rozetta Nunnery, NP   21 mg at 04/17/16 0827  . ondansetron (ZOFRAN-ODT) disintegrating tablet 4 mg  4 mg Oral Q6H PRN Jenne Campus, MD      . thiamine (VITAMIN B-1) tablet 100 mg  100 mg Oral Daily Jenne Campus, MD   100 mg at 04/17/16 0827  . traZODone (DESYREL) tablet 50 mg  50 mg Oral QHS,MR X 1 Rozetta Nunnery, NP   50 mg at 04/16/16 2152    Lab Results:  Results for orders placed or performed during the hospital encounter of 04/16/16 (from the past 48 hour(s))  Comprehensive metabolic panel     Status: Abnormal   Collection Time: 04/16/16  6:43 AM  Result Value Ref Range   Sodium 138 135 - 145 mmol/L   Potassium 4.0 3.5 - 5.1 mmol/L   Chloride 107 101 - 111 mmol/L   CO2 24 22 - 32 mmol/L   Glucose, Bld 157 (H) 65 - 99 mg/dL   BUN 14 6 - 20 mg/dL   Creatinine, Ser 0.88 0.61 - 1.24 mg/dL   Calcium 9.1 8.9 - 10.3 mg/dL   Total Protein 7.0 6.5 - 8.1 g/dL   Albumin 3.9 3.5 - 5.0 g/dL   AST 26 15 - 41 U/L   ALT 42 17 - 63 U/L   Alkaline Phosphatase 70 38 - 126 U/L   Total Bilirubin 0.5 0.3 - 1.2 mg/dL   GFR calc non Af Amer >60 >60 mL/min   GFR calc Af Amer >60 >60 mL/min    Comment: (NOTE) The eGFR has been calculated using the CKD EPI equation. This calculation has not been validated in all clinical situations. eGFR's persistently <60 mL/min signify possible Chronic Kidney Disease.    Anion gap 7 5 - 15    Comment: Performed at [facility]  Hepatic function panel     Status: None   Collection Time: 04/16/16  6:43 AM  Result Value Ref Range   Total Protein 7.1 6.5 - 8.1 g/dL   Albumin 3.9 3.5 - 5.0 g/dL   AST 26 15 - 41 U/L   ALT 43 17 - 63 U/L   Alkaline  Phosphatase 71 38 - 126 U/L   Total Bilirubin 0.5 0.3 - 1.2 mg/dL   Bilirubin, Direct 0.1 0.1 - 0.5 mg/dL   Indirect Bilirubin 0.4 0.3 - 0.9 mg/dL    Comment: Performed at [facility]  Lipid panel  Status: Abnormal   Collection Time: 04/16/16  6:43 AM  Result Value Ref Range   Cholesterol 171 0 - 200 mg/dL   Triglycerides 142 <150 mg/dL   HDL 32 (L) >40 mg/dL   Total CHOL/HDL Ratio 5.3 RATIO   VLDL 28 0 - 40 mg/dL   LDL Cholesterol 111 (H) 0 - 99 mg/dL    Comment:        Total Cholesterol/HDL:CHD Risk Coronary Heart Disease Risk Table                     Men   Women  1/2 Average Risk   3.4   3.3  Average Risk       5.0   4.4  2 X Average Risk   9.6   7.1  3 X Average Risk  23.4   11.0        Use the calculated Patient Ratio above and the CHD Risk Table to determine the patient's CHD Risk.        ATP III CLASSIFICATION (LDL):  <100     mg/dL   Optimal  100-129  mg/dL   Near or Above                    Optimal  130-159  mg/dL   Borderline  160-189  mg/dL   High  >190     mg/dL   Very High Performed at [facility]     Blood Alcohol level:  No results found for: [facility]  Metabolic Disorder Labs: No results found for: HGBA1C, MPG No results found for: PROLACTIN Lab Results  Component Value Date   CHOL 171 04/16/2016   TRIG 142 04/16/2016   HDL 32 (L) 04/16/2016   CHOLHDL 5.3 04/16/2016   VLDL 28 04/16/2016   LDLCALC 111 (H) 04/16/2016    Physical Findings: AIMS: Facial and Oral Movements Muscles of Facial Expression: None, normal Lips and Perioral Area: None, normal Jaw: None, normal Tongue: None, normal,Extremity Movements Upper (arms, wrists, hands, fingers): None, normal Lower (legs, knees, ankles, toes): None, normal, Trunk Movements Neck, shoulders, hips: None, normal, Overall Severity Severity of abnormal movements (highest score from questions above): None, normal Incapacitation due to abnormal movements: None,  normal Patient's awareness of abnormal movements (rate only patient's report): No Awareness, Dental Status Current problems with teeth and/or dentures?: No Does patient usually wear dentures?: No  CIWA:  CIWA-Ar Total: 0 COWS:     Musculoskeletal: Strength & Muscle Tone: within normal limits Gait & Station: normal Patient leans: N/A  Psychiatric Specialty Exam: Physical Exam  Review of Systems  Psychiatric/Behavioral: Positive for depression and substance abuse. Negative for hallucinations and suicidal ideas. The patient is nervous/anxious and has insomnia.   All other systems reviewed and are negative.   Blood pressure 100/72, pulse 81, temperature 97.6 F (36.4 C), resp. rate 20, height 5' 4.17" (1.63 m), weight 65.8 kg (145 lb), SpO2 97 %.Body mass index is 24.76 kg/m.  General Appearance: Disheveled  Eye Contact:  Fair  Speech:  Clear and Coherent and Normal Rate  Volume:  Normal  Mood:  Depressed  Affect:  Appropriate, Congruent and Depressed  Thought Process:  Coherent, Goal Directed, Linear and Descriptions of Associations: Intact  Orientation:  Full (Time, Place, and Person)  Thought Content:  Symptoms, worries, concerns, outpatient followup, medication management  Suicidal Thoughts:  No  Homicidal Thoughts:  No  Memory:  Immediate;   Fair Recent;   Fair  Remote;   Fair  Judgement:  Fair  Insight:  Fair  Psychomotor Activity:  Normal  Concentration:  Concentration: Fair and Attention Span: Fair  Recall:  [employer] of Knowledge:  Fair  Language:  Fair  Akathisia:  No  Handed:    AIMS (if indicated):     Assets:  resilience  ADL's:  Intact  Cognition:  WNL  Sleep:  Number of Hours: 4.5   Treatment Plan Summary: MDD (major depressive disorder), recurrent severe, without psychosis ([place]) with polysubstance abuse, unstable, managed as below:  Medications:  -Continue Vistaril 66m q6h prn anxiety -Continue Ativan 121mpo q6h prn per CIWA protocol -Continue  Trazodone 5027mo qhs repeat x1 prn insomnia -Continue Nicotine patch  Labs:  -Reviewed CBC, CMP and all WDL aside from mildly elevated DLD at 111, but not urgent at this time. Glucose 157 and will continue to monitor.   WitBenjamine MolaNP 04/17/2016, 2:11 PM    Agree with NP progress note as above

## 2016-04-17 NOTE — Progress Notes (Signed)
D: Patient remains isolative to bed, room. Does not participate in milieu, unit programming. Spoke with patient 1:1 who forwards minimal information. Rates sleep fair, appetite good, energy low and concentration poor. Patient's affect flat, mood depressed. Minimal eye contacts. Rating her depression at an 8/10, hopelessness at a 7/10 and anxiety at a 6/10. States goal for today is to "be sober." Denies pain, physical problems. Denying withdrawal symptoms. CIWA is a "0" with VSS.    A: Medicated per orders, no prns needed or requested. Emotional support offered and self inventory reviewed. Strongly encouraged to come out of room, interact with others.  R: Patient verbalizes understanding. Patient denies SI/HI and remains safe on level III obs. Will continue to encourage participation.

## 2016-04-18 ENCOUNTER — Inpatient Hospital Stay (HOSPITAL_COMMUNITY)
Admission: AD | Admit: 2016-04-18 | Discharge: 2016-04-18 | Disposition: A | Discharge status: Home / Self Care | Payer: Federal, State, Local not specified - Other | Source: Intra-hospital | Attending: Psychiatry | Admitting: Psychiatry

## 2016-04-18 DIAGNOSIS — F1490 Cocaine use, unspecified, uncomplicated: Secondary | ICD-10-CM

## 2016-04-18 MED ORDER — MIRTAZAPINE 15 MG PO TBDP: 15.0000 mg | ORAL_TABLET | Freq: Every day | ORAL | Status: DC

## 2016-04-18 NOTE — Progress Notes (Signed)
Recreation Therapy Notes  Date: 04/18/16 Time: 0930 Location: [address] Group Room  Group Topic: Stress Management  Goal Area(s) Addresses:  Patient will verbalize importance of using healthy stress management.  Patient will identify positive emotions associated with healthy stress management.   Intervention: Guided Imagery  Activity :  Depression Imagery.  LRT introduced the technique of guided imagery to the patients.  LRT read a script allowing patients to participate in the activity.  Patients were to follow along as LRT read script.  Education:  Stress Management, Discharge Planning.   Education Outcome: Acknowledges edcuation/In group clarification offered/Needs additional education  Clinical Observations/Feedback: Pt did not attend group.     Amparo Donalson, LRT/CTRS         Musette Kisamore A 04/18/2016 12:00 PM 

## 2016-04-18 NOTE — Progress Notes (Signed)
D: Patient resting in bed, isolative. Spoke with patient 1:1. Rates sleep as poor, appetite as good, energy as low and concentration as poor. Patient's affect flat, mood depressed. Minimal eye contact. Rating depression at a 7/10, hopelessness and anxiety both at an 8/10. States goal for today is to "need plan, rehab - hope for future." Denies pain, physical problems.   A: Medicated per orders, no prns requested, needed. Emotional support offered and self inventory reviewed. Encouraged completion of Suicide Safety Plan. Discussed POC with MD, SW.   R: Patient verbalizes understanding of POC. Patient denies SI/HI and remains safe on level III obs.

## 2016-04-18 NOTE — Tx Team (Signed)
Interdisciplinary Treatment and Diagnostic Plan Update  04/18/2016 Time of Session: 9:30 AM Harold Galvan MRN: [id number]  Principal Diagnosis: MDD (major depressive disorder), recurrent severe, without psychosis (HCC)  Secondary Diagnoses: Principal Problem:   MDD (major depressive disorder), recurrent severe, without psychosis (HCC) Active Problems:   Substance induced mood disorder (HCC)   Current Medications:  Current Facility-Administered Medications  Medication Dose Route Frequency Provider Last Rate Last Dose  . acetaminophen (TYLENOL) tablet 650 mg  650 mg Oral Q6H PRN Jackelyn PolingJason A Berry, NP   650 mg at 04/17/16 2135  . alum & mag hydroxide-simeth (MAALOX/MYLANTA) 200-200-20 MG/5ML suspension 30 mL  30 mL Oral Q4H PRN Jackelyn PolingJason A Berry, NP      . hydrOXYzine (ATARAX/VISTARIL) tablet 25 mg  25 mg Oral Q6H PRN Craige CottaFernando A Cobos, MD      . loperamide (IMODIUM) capsule 2-4 mg  2-4 mg Oral PRN Craige CottaFernando A Cobos, MD      . LORazepam (ATIVAN) tablet 1 mg  1 mg Oral Q6H PRN Craige CottaFernando A Cobos, MD   1 mg at 04/16/16 1818  . magnesium hydroxide (MILK OF MAGNESIA) suspension 30 mL  30 mL Oral Daily PRN Jackelyn PolingJason A Berry, NP   30 mL at 04/18/16 0819  . multivitamin with minerals tablet 1 tablet  1 tablet Oral Daily Craige CottaFernando A Cobos, MD   1 tablet at 04/18/16 0818  . nicotine (NICODERM CQ - dosed in mg/24 hours) patch 21 mg  21 mg Transdermal Daily Jackelyn PolingJason A Berry, NP   21 mg at 04/18/16 0818  . ondansetron (ZOFRAN-ODT) disintegrating tablet 4 mg  4 mg Oral Q6H PRN Craige CottaFernando A Cobos, MD      . thiamine (VITAMIN B-1) tablet 100 mg  100 mg Oral Daily Craige CottaFernando A Cobos, MD   100 mg at 04/18/16 0818  . traZODone (DESYREL) tablet 50 mg  50 mg Oral QHS,MR X 1 Jackelyn PolingJason A Berry, NP   50 mg at 04/17/16 2258   PTA Medications: Prescriptions Prior to Admission  Medication Sig Dispense Refill Last Dose  . acetaminophen (TYLENOL) 325 MG tablet Take 650 mg by mouth every 6 (six) hours as needed for mild pain or  headache.     . fluticasone (FLONASE) 50 MCG/ACT nasal spray Place 2 sprays into both nostrils daily. 16 g 2   . HYDROcodone-acetaminophen (NORCO/VICODIN) 5-325 MG tablet Take 2 tablets by mouth every 4 (four) hours as needed. 6 tablet 0   . loratadine (CLARITIN) 10 MG tablet Take 1 tablet (10 mg total) by mouth daily. 30 tablet 1     Patient Stressors: Financial difficulties Health problems Loss of vital relationships Marital or family conflict Occupational concerns Substance abuse  Patient Strengths: Ability for insight Average or above average Chief Operating Officerintelligence Communication skills Motivation for treatment/growth  Treatment Modalities: Medication Management, Group therapy, Case management,  1 to 1 session with clinician, Psychoeducation, Recreational therapy.   Physician Treatment Plan for Primary Diagnosis: MDD (major depressive disorder), recurrent severe, without psychosis (HCC) Long Term Goal(s): Improvement in symptoms so as ready for discharge Improvement in symptoms so as ready for discharge   Short Term Goals: Ability to identify changes in lifestyle to reduce recurrence of condition will improve Ability to demonstrate self-control will improve Ability to maintain clinical measurements within normal limits will improve Ability to identify triggers associated with substance abuse/mental health issues will improve Ability to verbalize feelings will improve Ability to demonstrate self-control will improve Ability to maintain clinical measurements within normal limits  will improve Compliance with prescribed medications will improve Ability to identify triggers associated with substance abuse/mental health issues will improve  Medication Management: Evaluate patient's response, side effects, and tolerance of medication regimen.  Therapeutic Interventions: 1 to 1 sessions, Unit Group sessions and Medication administration.  Evaluation of Outcomes: Progressing  Physician  Treatment Plan for Secondary Diagnosis: Principal Problem:   MDD (major depressive disorder), recurrent severe, without psychosis (HCC) Active Problems:   Substance induced mood disorder (HCC)  Long Term Goal(s): Improvement in symptoms so as ready for discharge Improvement in symptoms so as ready for discharge   Short Term Goals: Ability to identify changes in lifestyle to reduce recurrence of condition will improve Ability to demonstrate self-control will improve Ability to maintain clinical measurements within normal limits will improve Ability to identify triggers associated with substance abuse/mental health issues will improve Ability to verbalize feelings will improve Ability to demonstrate self-control will improve Ability to maintain clinical measurements within normal limits will improve Compliance with prescribed medications will improve Ability to identify triggers associated with substance abuse/mental health issues will improve     Medication Management: Evaluate patient's response, side effects, and tolerance of medication regimen.  Therapeutic Interventions: 1 to 1 sessions, Unit Group sessions and Medication administration.  Evaluation of Outcomes: Progressing   RN Treatment Plan for Primary Diagnosis: MDD (major depressive disorder), recurrent severe, without psychosis (HCC) Long Term Goal(s): Knowledge of disease and therapeutic regimen to maintain health will improve  Short Term Goals: Ability to participate in decision making will improve and Ability to identify and develop effective coping behaviors will improve  Medication Management: RN will administer medications as ordered by provider, will assess and evaluate patient's response and provide education to patient for prescribed medication. RN will report any adverse and/or side effects to prescribing provider.  Therapeutic Interventions: 1 on 1 counseling sessions, Psychoeducation, Medication administration,  Evaluate responses to treatment, Monitor vital signs and CBGs as ordered, Perform/monitor CIWA, COWS, AIMS and Fall Risk screenings as ordered, Perform wound care treatments as ordered.  Evaluation of Outcomes: Progressing   LCSW Treatment Plan for Primary Diagnosis: MDD (major depressive disorder), recurrent severe, without psychosis (HCC) Long Term Goal(s): Safe transition to appropriate next level of care at discharge, Engage patient in therapeutic group addressing interpersonal concerns.  Short Term Goals: Engage patient in aftercare planning with referrals and resources, Increase ability to appropriately verbalize feelings and Identify triggers associated with mental health/substance abuse issues  Therapeutic Interventions: Assess for all discharge needs, 1 to 1 time with Social worker, Explore available resources and support systems, Assess for adequacy in community support network, Educate family and significant other(s) on suicide prevention, Complete Psychosocial Assessment, Interpersonal group therapy.  Evaluation of Outcomes: Progressing   Progress in Treatment: Attending groups: No. Participating in groups: No. Taking medication as prescribed: No. Toleration medication: No. Family/Significant other contact made: No, will contact:  patient declined consent for collateral contact Patient understands diagnosis: Yes. Discussing patient identified problems/goals with staff: Yes. Medical problems stabilized or resolved: Yes. Denies suicidal/homicidal ideation: No. and As evidenced by:  admitted after accidental overdose, past history of suicide attempts by overdose, current abuse of illicit drugs Issues/concerns per patient self-inventory: No. Other: na  New problem(s) identified: No, Describe:  none at this time  New Short Term/Long Term Goal(s): none at this time  Discharge Plan or Barriers: homeless, no supports, wants residential substance treatment, needs  referrals  Reason for Continuation of Hospitalization: Depression Medication stabilization Suicidal ideation  Estimated  Length of Stay: 3 - 5 days  Attendees: Patient: 04/18/2016 11:01 AM  Physician: Alycia Rossetti MD 04/18/2016 11:01 AM  Nursing: Clotilde Dieter RN 04/18/2016 11:01 AM  RN Care Manager: 04/18/2016 11:01 AM  Social Worker: Governor Rooks LCSW 04/18/2016 11:01 AM  Recreational Therapist:  04/18/2016 11:01 AM  Other:  04/18/2016 11:01 AM  Other:  04/18/2016 11:01 AM  Other: 04/18/2016 11:01 AM    Scribe for Treatment Team: Sallee Lange, LCSW 04/18/2016 11:01 AM

## 2016-04-18 NOTE — Plan of Care (Signed)
Problem: Activity: Goal: Sleeping patterns will improve Outcome: Not Progressing Patient reports poor sleep. Lies in bed throughout the day however states he does not rest.  Problem: Safety: Goal: Ability to remain free from injury will improve Outcome: Progressing Patient has not engaged in self harm.

## 2016-04-18 NOTE — Progress Notes (Signed)
Write spoke with patient 1:1 and he reports that his day has been a little rocky. He expresses interest in long term treatment because he reports that after he lost his job and unable to find work has been rough. He reports that he needs help with his drug habits and want to clean himself up. Support given and safety maintained on unit with 15 min checks.

## 2016-04-18 NOTE — Progress Notes (Signed)
[facility]  04/18/2016 2:52 PM  Patient Active Problem List   Diagnosis Date Noted  . MDD (major depressive disorder), recurrent severe, without psychosis (HCC) 04/17/2016  . Substance induced mood disorder (HCC) 04/16/2016    Diagnosis: Cocaine use disorder, severe  Subjective: "I need to get back to the person I was." Patient relates that he was doing excessive amounts of crack cocaine and this is his drug of "choice. He states that he lost his job which didn't pay very much anyway and was staying with someone who is a heavy user of illicit substances and he began using heavily as well. He does endorse using more than expected being unable to cut down, cravings, use despite consequences such as losing his fianc in October of last year, spending considerable time and energy in use, and giving up things for use. He reports that on 10/5 he tried what he thought was heroin and had an accidental overdose and had to go to the emergency room. He states he was released went home and smoked methamphetamine straight for 24 hours to return to the emergency room seeking help. He currently denies active suicidal or homicidal plans. He does endorse that he still feels dizzy when he stands up and there is pain in his right wrist when he rotates and abducts it. He cannot recall if he had fallen on it or head fallen asleep on it while using.  Objective: Well-developed well-nourished slightly disheveled man in no apparent distress who is pleasant and cooperative he does appear to be slightly unsteady on his feet otherwise speech is and motor within normal limits. Mood is described as all right, affect is mildly anxious and dysphoric. Thought processes linear and goal-directed, thought content denies acute suicidal or homicidal ideation, plan or intent denies psychosis alert and oriented 3 insight is limited judgment is limited IQ appears an average range         Current Facility-Administered  Medications (Analgesics):  .  acetaminophen (TYLENOL) tablet 650 mg     Current Facility-Administered Medications (Other):  .  alum & mag hydroxide-simeth (MAALOX/MYLANTA) 200-200-20 MG/5ML suspension 30 mL .  hydrOXYzine (ATARAX/VISTARIL) tablet 25 mg .  loperamide (IMODIUM) capsule 2-4 mg .  LORazepam (ATIVAN) tablet 1 mg .  magnesium hydroxide (MILK OF MAGNESIA) suspension 30 mL .  multivitamin with minerals tablet 1 tablet .  nicotine (NICODERM CQ - dosed in mg/24 hours) patch 21 mg .  ondansetron (ZOFRAN-ODT) disintegrating tablet 4 mg .  thiamine (VITAMIN B-1) tablet 100 mg .  traZODone (DESYREL) tablet 50 mg  No current outpatient prescriptions on file.  Vital Signs:Blood pressure 97/64, pulse 86, temperature 97.6 F (36.4 C), resp. rate 16, height 5' 4.17" (1.63 m), weight 65.8 kg (145 lb), SpO2 97 %.    Lab Results: No results found for this or any previous visit (from the past 48 hour(s)).  Physical Findings: AIMS: Facial and Oral Movements Muscles of Facial Expression: None, normal Lips and Perioral Area: None, normal Jaw: None, normal Tongue: None, normal,Extremity Movements Upper (arms, wrists, hands, fingers): None, normal Lower (legs, knees, ankles, toes): None, normal, Trunk Movements Neck, shoulders, hips: None, normal, Overall Severity Severity of abnormal movements (highest score from questions above): None, normal Incapacitation due to abnormal movements: None, normal Patient's awareness of abnormal movements (rate only patient's report): No Awareness, Dental Status Current problems with teeth and/or dentures?: No Does patient usually wear dentures?: No  CIWA:  CIWA-Ar Total: 2 COWS:  Assessment/Plan: Patient who is using cocaine and methamphetamine heavily. He reports he is not a usual user of opiates and is perhaps tried heroin 3 times in his life including the overdose which she says was accidental 110/5/17. He reports that he would very much  like to have treatment for his substance use disorders. Currently we will continue with trazodone when necessary and Ativan when necessary for see what greater than 10 and I will place him on falls precautions secondary to his complaints of unsteadiness and he did look slightly unsteady on his feet. Patient was educated that he is probably quite tired and dehydrated from his drug use and that he should continue to take in fluids. He complains of right risk pain that is new onset we will order a x-ray to evaluate. Social worker will work with him on options for longer term treatment for substance use disorder.  Acquanetta Sit, MD 04/18/2016, 2:52 PM

## 2016-04-18 NOTE — Plan of Care (Signed)
Problem: Medication: Goal: Compliance with prescribed medication regimen will improve Outcome: Progressing Patient was compliant with his scheduled medication.

## 2016-04-19 MED ORDER — HYDROXYZINE HCL 50 MG PO TABS
50.0000 mg | ORAL_TABLET | Freq: Every evening | ORAL | Status: DC | PRN
  Administered 2016-04-19: 50 mg via ORAL
  Filled 2016-04-19: qty 1
  Filled 2016-04-19: qty 7

## 2016-04-19 NOTE — [facility] Group Notes (Signed)
[facility] Group Notes:  (Nursing/MHT/Case Management/Adjunct)  Date:  04/19/2016  Time:  10:15 AM   Type of Therapy:  Psychoeducational Skills  Participation Level:  Active  Participation Quality:  Appropriate  Affect:  Appropriate  Cognitive:  Appropriate  Insight:  Appropriate  Engagement in Group:  Engaged  Modes of Intervention:  Problem-solving  Summary of Progress/Problems: Group encouraged to surround themselves with positive and healthy group/support system when changing to a healthy life style.    Harold PunchesJane O Naaman Galvan 04/19/2016, 10:15 AM

## 2016-04-19 NOTE — Progress Notes (Signed)
Patient attended AA group meeting tonight.  

## 2016-04-19 NOTE — Progress Notes (Signed)
[facility]  04/19/2016 1:07 PM  Patient Active Problem List   Diagnosis Date Noted  . MDD (major depressive disorder), recurrent severe, without psychosis (HCC) 04/17/2016  . Substance induced mood disorder (HCC) 04/16/2016    Diagnosis: Cocaine use disorder, severe  Subjective: Patient describes his mood as "I don't know." He denies any current suicidal or homicidal ideation, plan or intent. He is giving considerable thought to what his next step would be in pursuing his recovery. He would not like to return to work he was living before and we discussed temporizing options while he applies to perhaps a long-term program. He states he is not sure the trazodone has been effective for sleep and it also gave him very vivid dreams.  Objective: Well developed well nourished slightly disheveled white male in no apparent distress who is pleasant and cooperative mood is a bit anxious affect is congruent thought processes linear and goal directed thought content no current suicidal or homicidal ideation, plan or intent no psychosis speech and motor appear within normal limits alert and oriented 3 insight and judgment are fair IQ appears an average range        Current Facility-Administered Medications (Analgesics):  .  acetaminophen (TYLENOL) tablet 650 mg     Current Facility-Administered Medications (Other):  .  alum & mag hydroxide-simeth (MAALOX/MYLANTA) 200-200-20 MG/5ML suspension 30 mL .  hydrOXYzine (ATARAX/VISTARIL) tablet 25 mg .  hydrOXYzine (ATARAX/VISTARIL) tablet 50 mg .  loperamide (IMODIUM) capsule 2-4 mg .  LORazepam (ATIVAN) tablet 1 mg .  magnesium hydroxide (MILK OF MAGNESIA) suspension 30 mL .  multivitamin with minerals tablet 1 tablet .  nicotine (NICODERM CQ - dosed in mg/24 hours) patch 21 mg .  ondansetron (ZOFRAN-ODT) disintegrating tablet 4 mg .  thiamine (VITAMIN B-1) tablet 100 mg  No current outpatient prescriptions on file.  Vital  Signs:Blood pressure 92/70, pulse 90, temperature 98.4 F (36.9 C), temperature source Oral, resp. rate 16, height 5' 4.17" (1.63 m), weight 65.8 kg (145 lb), SpO2 97 %.    Lab Results: No results found for this or any previous visit (from the past 48 hour(s)).  Physical Findings: AIMS: Facial and Oral Movements Muscles of Facial Expression: None, normal Lips and Perioral Area: None, normal Jaw: None, normal Tongue: None, normal,Extremity Movements Upper (arms, wrists, hands, fingers): None, normal Lower (legs, knees, ankles, toes): None, normal, Trunk Movements Neck, shoulders, hips: None, normal, Overall Severity Severity of abnormal movements (highest score from questions above): None, normal Incapacitation due to abnormal movements: None, normal Patient's awareness of abnormal movements (rate only patient's report): No Awareness, Dental Status Current problems with teeth and/or dentures?: No Does patient usually wear dentures?: No  CIWA:  CIWA-Ar Total: 2 COWS:      Assessment/Plan: Patient appears to be successfully detoxified from any substances he ingested at this time. Plan is for him to work with social work to set up more longer term substance abuse treatment at this time.  Acquanetta SitElizabeth Woods Oates, MD 04/19/2016, 1:07 PM

## 2016-04-19 NOTE — Progress Notes (Signed)
D: Pt was in bed in his room upon initial approach.  Pt has depressed affect and mood.  His goal is to "stay alive and make it."  Pt forwards little information to Probation officer.  Pt denies SI/HI, denies hallucinations, denies pain.  He has stayed in his room for the majority of the night.    A: Introduced self to pt.  Met with pt and offered support and encouragement.  Medication administered per order.  Medication education provided.  R: Pt is compliant with medications.  Pt verbally contracts for safety.  Will continue to monitor and assess.

## 2016-04-19 NOTE — Plan of Care (Signed)
Problem: Education: Goal: Emotional status will improve Outcome: Progressing Patient reports decreased depressive symptoms.  He denies self harm thoughts.

## 2016-04-19 NOTE — Progress Notes (Signed)
Recreation Therapy Notes  Animal-Assisted Activity (AAA) Program Checklist/Progress Notes Patient Eligibility Criteria Checklist & Daily Group note for Rec TxIntervention  Date: 10.10.2017 Time: 3:00pm [facility] [address]    AAA/T Program Assumption of Risk Form signed by Patient/ or Parent Legal Guardian Yes  Patient is free of allergies or sever asthma Yes  Patient reports no fear of animals Yes  Patient reports no history of cruelty to animals Yes  Patient understands his/her participation is voluntary Yes  Behavioral Response: Did not attend  [employer] Specialty ChemicalsDenise L Reola Buckles, LRT/CTRS  Namine Beahm L 04/19/2016 3:11 PM

## 2016-04-19 NOTE — [facility] Group Notes (Signed)
Type of Therapy: Mental Health Association Presentation  Participation Level: Active  Participation Quality: Attentive  Affect: Appropriate  Cognitive: Oriented  Insight: Developing/Improving  Engagement in Therapy: Engaged  Modes of Intervention: Discussion, Education and Socialization  Summary of Progress/Problems: Mental Health Association (MHA) Speaker came to talk about his personal journey with substance abuse and addiction. The pt processed ways by which to relate to the speaker. MHA speaker provided handouts and educational information pertaining to groups and services offered by the MHA. Pt was engaged in speaker's presentation and was receptive to resources provided.  Elhadj Girton LCSW, MSW Clinical Social Work: [facility]  

## 2016-04-19 NOTE — Plan of Care (Signed)
Problem: Activity: Goal: Interest or engagement in activities will improve Outcome: Progressing Pt has been more visible in milieu tonight.  He attended evening group and has been more interactive with peers and staff.

## 2016-04-19 NOTE — Progress Notes (Signed)
D: Pt was in the day room upon initial approach.  Pt presents with depressed affect and mood.  He reports his goal is to "have a reasonably decent day, I think I have, I haven't been down, been positive."  He reports mild constipation.  Reports he had a small bowel movement yesterday.  Pt denies SI/HI, denies hallucinations, denies pain.  He expressed that he would like to discuss potentially going to long[facility].  Pt has been more visible in milieu tonight.  Pt attended evening group.    A: Actively listened to pt and offered support and encouragement.  PRN medication administered for constipation and sleep.  Encouraged pt to discuss aftercare options with Child psychotherapistsocial worker.    R: Pt is compliant with medications.  Pt verbally contracts for safety.  Will continue to monitor and assess.

## 2016-04-19 NOTE — Progress Notes (Signed)
Psychoeducational Group Note  Date:  04/19/2016 Time:  0006  Group Topic/Focus:  Wrap-Up Group:   The focus of this group is to help patients review their daily goal of treatment and discuss progress on daily workbooks.   Participation Level: Did Not Attend  Participation Quality:  Not Applicable  Affect:  Not Applicable  Cognitive:  Not Applicable  Insight:  Not Applicable  Engagement in Group: Not Applicable  Additional Comments:  The patient did not attend group last evening since he was asleep in his bedroom.    Harold Galvan 04/19/2016, 12:06 AM

## 2016-04-19 NOTE — Progress Notes (Signed)
D: Patient complains of poor sleep last night due to vivid dreams which he contributed to the trazodone he was given.  Patient continues to exhibit flat affect with depressed mood.  He rates his depression as a 5; hopelessness as a 6; anxiety as a 4.  He denies any withdrawal symptoms.  Patient denies any thoughts of self harm.  Patient has minimal interaction with staff and peers. A: Continue to monitor medication management and MD orders.  Safety checks completed every 15 minutes per protocol.  Offer support and encouragement as needed. R: Patient's behavior is appropriate.

## 2016-04-20 NOTE — Progress Notes (Signed)
Patient did not attend NA group meeting tonight.  

## 2016-04-20 NOTE — Plan of Care (Signed)
Problem: Education: Goal: Mental status will improve Outcome: Progressing Patient reports decreased depressive symptoms.

## 2016-04-20 NOTE — Progress Notes (Signed)
LCSW following and assisting with aftercare. Patient reports he cannot go back to [place]. He reports he would like long term treatment and LCSW discussed [facility] in which he is agreeable.  LCSW will also give patient information about [facility], [facility], And will [facility].  Patient encouraged to call and set up intake appointment and see if bed available. He [place] not qualify [place] [facility] [place].  Will continue to assist with aftercare plan.  Deretha EmoryHannah Keymiah Lyles LCSW, MSW Clinical Social Work: Optician, dispensingystem [employer]

## 2016-04-20 NOTE — Progress Notes (Signed)
D   Pt spent the majority of the shift in his bed   He has isolated and not participating in the unit activities   He denies suicidal ideation and refused sleep medication   He said he didn't need it A   Verbal support given   Medications offered   Q 15 min checks R   Pt safe at present

## 2016-04-20 NOTE — Progress Notes (Signed)
[facility] referral completed. Patient instructed to complete prescreen today at 3:45pm. Was told to call and complete, possible bed if prescreen goes well for Thursday.  Will follow up on Thursday morning.  Harold EmoryHannah Jaleeyah Munce LCSW, MSW Clinical Social Work: Optician, dispensingystem [employer]

## 2016-04-20 NOTE — Progress Notes (Signed)
Recreation Therapy Notes  Date: 04/20/16 Time: 0930 Location: 300 [address] Dayroom  Group Topic: Stress Management  Goal Area(s) Addresses:  Patient will verbalize importance of using healthy stress management.  Patient will identify positive emotions associated with healthy stress management.   Intervention: Stress Management  Activity :  Progressive Muscle Relaxation.  LRT introduced the technique of progressive muscle relaxation.  LRT explained that the technique allows patients to relax muscle groups one at a time.  LRT read a script to guide the patients through the technique.  Pt were to follow along as LRT read script to engage in technique.  Education:  Stress Management, Discharge Planning.   Education Outcome: Acknowledges edcuation/In group clarification offered/Needs additional education  Clinical Observations/Feedback: Pt did not attend group.    Caroll RancherMarjette Emmett Arntz, LRT/CTRS         Caroll RancherLindsay, Hildy Nicholl A 04/20/2016 12:59 PM

## 2016-04-20 NOTE — Progress Notes (Signed)
[facility]  04/20/2016 9:13 AM  Patient Active Problem List   Diagnosis Date Noted  . MDD (major depressive disorder), recurrent severe, without psychosis (HCC) 04/17/2016  . Substance induced mood disorder (HCC) 04/16/2016    Diagnosis: Cocaine use disorder, severe  Subjective: Patient denies any acute suicidal or homicidal ideation, plan or intent he denies any acute withdrawal symptoms. He states he feels he is ready to move on to the next step of treatment and would like to meet with the social worker as soon as possible.  Objective: Well developed well nourished slightly disheveled white male in hospital scrubs who is ambulating freely about the unit and alert and oriented 3 speech and motor within normal limits thought processes linear and goal-directed thought content denies any suicidal or homicidal ideation plan or intent mood is all right affect is slightly dysphoric and anxious alert and oriented 3 insight and judgment are fair IQ appears an average range         Current Facility-Administered Medications (Analgesics):  .  acetaminophen (TYLENOL) tablet 650 mg     Current Facility-Administered Medications (Other):  .  alum & mag hydroxide-simeth (MAALOX/MYLANTA) 200-200-20 MG/5ML suspension 30 mL .  hydrOXYzine (ATARAX/VISTARIL) tablet 50 mg .  magnesium hydroxide (MILK OF MAGNESIA) suspension 30 mL .  multivitamin with minerals tablet 1 tablet .  nicotine (NICODERM CQ - dosed in mg/24 hours) patch 21 mg .  thiamine (VITAMIN B-1) tablet 100 mg  No current outpatient prescriptions on file.  Vital Signs:Blood pressure 92/70, pulse 90, temperature 98.4 F (36.9 C), temperature source Oral, resp. rate 16, height 5' 4.17" (1.63 m), weight 65.8 kg (145 lb), SpO2 97 %.    Lab Results: No results found for this or any previous visit (from the past 48 hour(s)).  Physical Findings: AIMS: Facial and Oral Movements Muscles of Facial Expression: None,  normal Lips and Perioral Area: None, normal Jaw: None, normal Tongue: None, normal,Extremity Movements Upper (arms, wrists, hands, fingers): None, normal Lower (legs, knees, ankles, toes): None, normal, Trunk Movements Neck, shoulders, hips: None, normal, Overall Severity Severity of abnormal movements (highest score from questions above): None, normal Incapacitation due to abnormal movements: None, normal Patient's awareness of abnormal movements (rate only patient's report): No Awareness, Dental Status Current problems with teeth and/or dentures?: No Does patient usually wear dentures?: No  CIWA:  CIWA-Ar Total: 2 COWS:      Assessment/Plan: Patient appears to be stabilizing and has denied any acute suicidal or homicidal ideation plan or intent or acute withdrawal symptoms today he does wish to work with Child psychotherapistsocial worker as soon as possible to coordinate discharge planning and the social worker will be informed.  Acquanetta SitElizabeth Woods Oates, MD 04/20/2016, 9:13 AM

## 2016-04-20 NOTE — [facility] Group Notes (Signed)
[facility] LCSW Group Therapy  04/20/2016 1:50 PM  Type of Therapy:  Group Therapy  Participation Level:  None   Patient is more interested in his aftercare plan and what he will do when he is discharged. He is not wanting to process group topic.    Raye SorrowCoble, Ebbie Sorenson N 04/20/2016, 1:50 PM

## 2016-04-20 NOTE — Progress Notes (Signed)
D: Patient has brighter affect today.  He reports decreased depressive symptoms.  He denies any thoughts of self harm.  His goal today was to "talk with the social worker."  Patient continues to be isolative to his room.  He is focused on his aftercare plan at discharge.  Patient has been given information/resources on long term treatment.  Patient has had minimal interaction with staff and peers.  He denies any withdrawal symptoms. A: Continue to monitor medication management and MD orders.  Safety checks completed every 15 minutes per protocol.  Offer support and encouragement as needed. R: Patient is receptive to staff; his behavior is appropriate.

## 2016-04-20 NOTE — Progress Notes (Signed)
[facility] Group Notes:  (Nursing/MHT/Case Management/Adjunct)  Date:  04/20/2016  Time:  10:59 AM  Type of Therapy:  Nurse Education  Participation Level:  Active  Participation Quality:  Appropriate  Affect:  Appropriate  Cognitive:  Alert and Oriented  Insight:  Appropriate  Engagement in Group:  Engaged  Modes of Intervention:  Activity, Education, Socialization and Support  Summary of Progress/Problems:The pupose of this group is to educate and introduce patients to the benefits of aromatherapy. Assessment completed prior to group. Discussed aromatherapy treatment to enhance and support patient's treatment goals.   Beatrix ShipperWright, Alleyne Lac Martin 04/20/2016, 10:59 AM

## 2016-04-21 MED ORDER — NICOTINE 21 MG/24HR TD PT24: 21.0000 mg | MEDICATED_PATCH | Freq: Every day | TRANSDERMAL | 0 refills | Status: AC

## 2016-04-21 MED ORDER — HYDROXYZINE HCL 50 MG PO TABS: 50.0000 mg | ORAL_TABLET | Freq: Every evening | ORAL | 0 refills | Status: AC | PRN

## 2016-04-21 NOTE — [facility] Group Notes (Signed)
[facility] Group Notes:  (Nursing/MHT/Case Management/Adjunct)  Date:  04/21/2016  Time:  2:13 PM  Type of Therapy:  Group Therapy  Participation Level:  Active  Participation Quality:  Appropriate  Affect:  Anxious and Appropriate  Cognitive:  Appropriate  Insight:  Improving  Engagement in Group:  Engaged  Modes of Intervention:  Discussion, Exploration and Support  Summary of Progress/Problems:  Finding Balance in Life. Today's group focused on defining balance in one's own words, identifying things that can knock one off balance, and exploring healthy ways to maintain balance in life. Group members were asked to provide an example of a time when they felt off balance, describe how they handled that situation, and process healthier ways to regain balance in the future. Group members were asked to share the most important tool for maintaining balance that they learned [facility] and how they plan to apply this method after discharge.  Pt discussed anxiety over upcoming placement at recovery facility - unsure of rules, accommodations, expectations.  Feels unsure but willing to accept help.  Remembers loss of significant other as most difficult cost of substance use, wonders when he can forgive himself.    Harold Galvan 04/21/2016, 2:13 PM

## 2016-04-21 NOTE — Progress Notes (Signed)
  [facility] Adult Case [facility] Discharge Plan :  Will you be returning to the same living situation after discharge:  No.  Will admit to [facility] At discharge, do you have transportation home?: Yes,  [facility] Zenaida Niecevan will transport Do you have the ability to pay for your medications: No. Facility will assist, [facility] providing [facility] medications to assist  Release of information consent forms completed and in the chart;  Patient's signature needed at discharge.  Patient to Follow up at: Follow-up Information    [facility]. Go in 1 day(s).   Why:  Patient will admit to this facility for continued treatment today, faciilty will transport. Contact information: [address] [place]  [id number] Phone:  [phone] Fax:  [phone]          Next level of care provider has access to [facility] Link:no  Safety Planning and Suicide Prevention discussed: Yes,  reviewed w patient in group, pt refused consent for collateral contact  Have you used any form of tobacco in the last 30 days? (Cigarettes, Smokeless Tobacco, Cigars, and/or Pipes): Yes  Has patient been referred to the Quitline?: Patient refused referral  Patient has been referred for addiction treatment: Yes  Sallee Langenne C Cunningham 04/21/2016, 9:01 AM

## 2016-04-21 NOTE — Clinical Social Work Note (Signed)
[facility], facility transport will pick him up at 11:30 AM today.  Santa GeneraAnne Pamila Mendibles, LCSW Lead Clinical Social Worker Phone:  [phone]

## 2016-04-21 NOTE — [facility] Suicide Risk Assessment (Signed)
[facility]   Principal Problem: MDD (major depressive disorder), recurrent severe, without psychosis (HCC) Discharge Diagnoses:  Patient Active Problem List   Diagnosis Date Noted  . MDD (major depressive disorder), recurrent severe, without psychosis (HCC) [F33.2] 04/17/2016  . Substance induced mood disorder (HCC) [F19.94] 04/16/2016    Total Time spent with patient: 15 minutes  Musculoskeletal: Strength & Muscle Tone: within normal limits Gait & Station: normal Patient leans: N/A  Psychiatric Specialty Exam: ROS  Blood pressure 117/74, pulse 77, temperature 98.1 F (36.7 C), temperature source Oral, resp. rate 18, height 5' 4.17" (1.63 m), weight 65.8 kg (145 lb), SpO2 97 %.Body mass index is 24.76 kg/m.  General Appearance: Casual  Eye Contact::  Good  Speech:  Clear and Coherent409  Volume:  Normal  Mood:  Anxious  Affect:  Appropriate and Congruent  Thought Process:  Coherent and Goal Directed  Orientation:  Full (Time, Place, and Person)  Thought Content:  Negative  Suicidal Thoughts:  No  Homicidal Thoughts:  No  Memory:  Negative  Judgement:  Fair  Insight:  Fair  Psychomotor Activity:  Normal  Concentration:  Good  Recall:  Good  Fund of Knowledge:Good  Language: Good  Akathisia:  No  Handed:  Right  AIMS (if indicated):     Assets:  Communication Skills Desire for Improvement Resilience  Sleep:  Number of Hours: 6.75  Cognition: WNL  ADL's:  Intact   Mental Status Per Nursing Assessment::   On Admission:  Suicidal ideation indicated by patient, Suicide plan, Self-harm thoughts, Self-harm behaviors  Demographic Factors:  Male, Caucasian and Unemployed  Loss Factors: Decrease in vocational status and Financial problems/change in socioeconomic status  Historical Factors: NA  Risk Reduction Factors:   Positive coping skills or problem solving skills  Continued Clinical Symptoms:  Alcohol/Substance  Abuse/Dependencies  Cognitive Features That Contribute To Risk:  None    Suicide Risk:  Mild:  Suicidal ideation of limited frequency, intensity, duration, and specificity.  There are no identifiable plans, no associated intent, mild dysphoria and related symptoms, good self-control (both objective and subjective assessment), few other risk factors, and identifiable protective factors, including available and accessible social support.    Plan Of Care/Follow-up recommendations:  Other:  The patient has successfully completed detoxification and denies any suicidal or homicidal ideation, plan or intent. Is recommended that he continue with his current psychiatric and substance use planned follow-up after discharge.  Acquanetta SitElizabeth Woods Olivia Pavelko, MD 04/21/2016, 8:43 AM

## 2016-04-21 NOTE — Tx Team (Signed)
Interdisciplinary Treatment and Diagnostic Plan Update  04/21/2016 Time of Session: 9:30 AM Harold Galvan MRN: [id number]  Principal Diagnosis: MDD (major depressive disorder), recurrent severe, without psychosis ([place])  Secondary Diagnoses: Principal Problem:   MDD (major depressive disorder), recurrent severe, without psychosis ([place]) Active Problems:   Substance induced mood disorder ([place])   Current Medications:  Current Facility-Administered Medications  Medication Dose Route Frequency Provider Last Rate Last Dose  . acetaminophen (TYLENOL) tablet 650 mg  650 mg Oral Q6H PRN Rozetta Nunnery, NP   650 mg at 04/17/16 2135  . alum & mag hydroxide-simeth (MAALOX/MYLANTA) 200-200-20 MG/5ML suspension 30 mL  30 mL Oral Q4H PRN Rozetta Nunnery, NP      . hydrOXYzine (ATARAX/VISTARIL) tablet 50 mg  50 mg Oral QHS PRN Linard Millers, MD   50 mg at 04/19/16 2151  . magnesium hydroxide (MILK OF MAGNESIA) suspension 30 mL  30 mL Oral Daily PRN Rozetta Nunnery, NP   30 mL at 04/19/16 2151  . multivitamin with minerals tablet 1 tablet  1 tablet Oral Daily Jenne Campus, MD   1 tablet at 04/20/16 0806  . nicotine (NICODERM CQ - dosed in mg/24 hours) patch 21 mg  21 mg Transdermal Daily Rozetta Nunnery, NP   21 mg at 04/20/16 0807  . thiamine (VITAMIN B-1) tablet 100 mg  100 mg Oral Daily Jenne Campus, MD   100 mg at 04/20/16 0350   PTA Medications: Prescriptions Prior to Admission  Medication Sig Dispense Refill Last Dose  . acetaminophen (TYLENOL) 325 MG tablet Take 650 mg by mouth every 6 (six) hours as needed for mild pain or headache.     . fluticasone (FLONASE) 50 MCG/ACT nasal spray Place 2 sprays into both nostrils daily. 16 g 2   . HYDROcodone-acetaminophen (NORCO/VICODIN) 5-325 MG tablet Take 2 tablets by mouth every 4 (four) hours as needed. 6 tablet 0   . loratadine (CLARITIN) 10 MG tablet Take 1 tablet (10 mg total) by mouth daily. 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   Physician Treatment Plan for Primary Diagnosis: MDD (major depressive disorder), recurrent severe, without psychosis ([place]) Long Term Goal(s): Improvement in symptoms so as ready for discharge Improvement in symptoms so as ready for discharge   Short Term Goals: Ability to identify changes in lifestyle to reduce recurrence of condition will improve Ability to demonstrate self-control will improve Ability to maintain clinical measurements within normal limits will improve Ability to identify triggers associated with substance abuse/mental health issues will improve Ability to verbalize feelings will improve Ability to demonstrate self-control will improve Ability to maintain clinical measurements within normal limits will improve Compliance with prescribed medications will improve Ability to identify triggers associated with substance abuse/mental health issues will improve  Medication Management: Evaluate patient's response, side effects, and tolerance of medication regimen.  Therapeutic Interventions: 1 to 1 sessions, Unit Group sessions and Medication administration.  Evaluation of Outcomes: Adequate for Discharge  Physician Treatment Plan for Secondary Diagnosis: Principal Problem:   MDD (major depressive disorder), recurrent severe, without psychosis ([place]) Active Problems:   Substance induced mood disorder ([place])  Long Term Goal(s): Improvement in  symptoms so as ready for discharge Improvement in symptoms so as ready for discharge   Short Term Goals: Ability to  identify changes in lifestyle to reduce recurrence of condition will improve Ability to demonstrate self-control will improve Ability to maintain clinical measurements within normal limits will improve Ability to identify triggers associated with substance abuse/mental health issues will improve Ability to verbalize feelings will improve Ability to demonstrate self-control will improve Ability to maintain clinical measurements within normal limits will improve Compliance with prescribed medications will improve Ability to identify triggers associated with substance abuse/mental health issues will improve     Medication Management: Evaluate patient's response, side effects, and tolerance of medication regimen.  Therapeutic Interventions: 1 to 1 sessions, Unit Group sessions and Medication administration.  Evaluation of Outcomes: Adequate for Discharge   RN Treatment Plan for Primary Diagnosis: MDD (major depressive disorder), recurrent severe, without psychosis ([place]) Long Term Goal(s): Knowledge of disease and therapeutic regimen to maintain health will improve  Short Term Goals: Ability to participate in decision making will improve and Ability to identify and develop effective coping behaviors will improve  Medication Management: RN will administer medications as ordered by provider, will assess and evaluate patient's response and provide education to patient for prescribed medication. RN will report any adverse and/or side effects to prescribing provider.  Therapeutic Interventions: 1 on 1 counseling sessions, Psychoeducation, Medication administration, Evaluate responses to treatment, Monitor vital signs and CBGs as ordered, Perform/monitor CIWA, COWS, AIMS and Fall Risk screenings as ordered, Perform wound care treatments as ordered.  Evaluation of Outcomes: Adequate for Discharge   LCSW Treatment Plan for Primary Diagnosis: MDD (major depressive disorder), recurrent severe, without  psychosis ([place]) Long Term Goal(s): Safe transition to appropriate next level of care at discharge, Engage patient in therapeutic group addressing interpersonal concerns.  Short Term Goals: Engage patient in aftercare planning with referrals and resources, Increase ability to appropriately verbalize feelings and Identify triggers associated with mental health/substance abuse issues  Therapeutic Interventions: Assess for all discharge needs, 1 to 1 time with Social worker, Explore available resources and support systems, Assess for adequacy in community support network, Educate family and significant other(s) on suicide prevention, Complete Psychosocial Assessment, Interpersonal group therapy.  Evaluation of Outcomes: Met   Progress in Treatment: Attending groups: Yes Participating in groups: Yes, minimal Taking medication as prescribed: Yes Toleration medication: Yes Family/Significant other contact made: No, will contact:  patient declined consent for collateral contact Patient understands diagnosis: Yes. Discussing patient identified problems/goals with staff: Yes. Medical problems stabilized or resolved: Yes. Denies suicidal/homicidal ideation: Yes, stable for discharge for continued treatment Issues/concerns per patient self-inventory: No. Other: na  New problem(s) identified: No, Describe:  none at this time  New Short Term/Long Term Goal(s): none at this time  Discharge Plan or Barriers: homeless, no supports, wants residential substance treatment, needs referrals  Reason for Continuation of Hospitalization: Depression Medication stabilization Suicidal ideation  Estimated Length of Stay: discharge today  Attendees: Patient: 04/21/2016 9:03 AM  Physician: Mila Merry MD 04/21/2016 9:03 AM  Nursing: Jacklyn Shell RN; [place] RN 04/21/2016 9:03 AM  RN Care Manager: 04/21/2016 9:03 AM  Social Worker: Eusebio Me LCSW 04/21/2016 9:03 AM  Recreational Therapist:  04/21/2016 9:03 AM   Other: Calton Golds NP 04/21/2016 9:03 AM  Other:  04/21/2016 9:03 AM  Other: 04/21/2016 9:03 AM    Scribe for Treatment Team: Beverely Pace, LCSW 04/21/2016 9:03 AM

## 2016-04-21 NOTE — Discharge Summary (Signed)
Physician Discharge Summary Note  Patient:  Harold Galvan is an 55 y.o., male MRN:  [id number] DOB:  May 09, 1961 Patient phone:  [phone] (home)  Patient address:   [place] [address],  Total Time spent with patient: 30 minutes  Date of Admission:  04/16/2016 Date of Discharge: 04/21/2016  Reason for Admission:  Heroin accidental overdose  Principal Problem: MDD (major depressive disorder), recurrent severe, without psychosis [facility]) Discharge Diagnoses: Patient Active Problem List   Diagnosis Date Noted  . MDD (major depressive disorder), recurrent severe, without psychosis (HCC) [F33.2] 04/17/2016  . Substance induced mood disorder [facility]) [F19.94] 04/16/2016    Past Psychiatric History:  See HPI  Past Medical History: History reviewed. No pertinent past medical history. History reviewed. No pertinent surgical history. Family History: History reviewed. No pertinent family history. Family Psychiatric  History: see HPI Social History:  History  Alcohol Use  . 3.6 oz/week  . 6 Cans of beer per week     History  Drug Use  . Types: Amphetamines, "Crack" cocaine, Methamphetamines, Heroin    Comment: Heroin use per report; UDS + coc, meth, amph    Social History   Social History  . Marital status: Single    Spouse name: N/A  . Number of children: N/A  . Years of education: N/A   Social History Main Topics  . Smoking status: Current Every Day Smoker  . Smokeless tobacco: Never Used  . Alcohol use 3.6 oz/week    6 Cans of beer per week  . Drug use:     Types: Amphetamines, "Crack" cocaine, Methamphetamines, Heroin     Comment: Heroin use per report; UDS + coc, meth, amph  . Sexual activity: Not Currently   Other Topics Concern  . None   Social History Narrative  . None    Hospital Course:  Harold Galvan, 55 year old male hwo presented to [facility] via EMS after an apparent accidental drug overdose.  Pt stated on on or about 04/14/16, he  experienced an accidental heroin overdose and so was transported to the hospital.  "I'm a drug addict.  I'll use whatever is in front of me, as much as I can."  Pt's UDS indicated the presence of cocaine, amphetamines, and methamphetamines.  UDS not positive for opioids.    Harold Galvan was admitted for MDD (major depressive disorder), recurrent severe, without psychosis (HCC) and crisis management.  Patient was treated with medications with their indications listed below in detail under Medication List.  Medical problems were identified and treated as needed.  Home medications were restarted as appropriate.  Improvement was monitored by observation and Harold Galvan daily report of symptom reduction.  Emotional and mental status was monitored by daily self inventory reports completed by Harold Galvan and clinical staff.  Patient reported continued improvement, denied any new concerns.  Patient had been compliant on medications and denied side effects.  Support and encouragement was provided.    Patient encouraged to attend groups to help with recognizing triggers of emotional crises and de-stabilizations.  Patient encouraged to attend group to help identify the positive things in life that would help in dealing with feelings of loss, depression and unhealthy or abusive tendencies.         Harold Galvan was evaluated by the treatment team for stability and plans for continued recovery upon discharge.  Patient was offered further treatment options upon discharge including Residential, Intensive Outpatient and Outpatient treatment. Patient will follow  up with agency listed below for medication management and counseling.  Encouraged patient to maintain satisfactory support network and home environment.  Advised to adhere to medication compliance and outpatient treatment follow up.  Prescriptions provided.       Upon completion of this admission the patient was both mentally  and medically stable for discharge denying suicidal/homicidal ideation, auditory/visual/tactile hallucinations, delusional thoughts and paranoia.      Physical Findings: AIMS: Facial and Oral Movements Muscles of Facial Expression: None, normal Lips and Perioral Area: None, normal Jaw: None, normal Tongue: None, normal,Extremity Movements Upper (arms, wrists, hands, fingers): None, normal Lower (legs, knees, ankles, toes): None, normal, Trunk Movements Neck, shoulders, hips: None, normal, Overall Severity Severity of abnormal movements (highest score from questions above): None, normal Incapacitation due to abnormal movements: None, normal Patient's awareness of abnormal movements (rate only patient's report): No Awareness, Dental Status Current problems with teeth and/or dentures?: No Does patient usually wear dentures?: No  CIWA:  CIWA-Ar Total: 2 COWS:     Musculoskeletal: Strength & Muscle Tone: within normal limits Gait & Station: normal Patient leans: N/A  Psychiatric Specialty Exam:  SEE MD SRA Physical Exam  Nursing note and vitals reviewed. Psychiatric: He has a normal mood and affect. His behavior is normal. Judgment and thought content normal. He is not agitated. Thought content is not paranoid. Cognition and memory are normal. Cognition and memory are not impaired. He expresses no homicidal and no suicidal ideation. He exhibits normal recent memory and normal remote memory.    Review of Systems  Constitutional: Negative.   HENT: Negative.   Eyes: Negative.   Respiratory: Negative.   Cardiovascular: Negative.   Gastrointestinal: Negative.   Genitourinary: Negative.   Musculoskeletal: Negative.   Skin: Negative.   Neurological: Negative.   Endo/Heme/Allergies: Negative.   Psychiatric/Behavioral: Negative.   All other systems reviewed and are negative.   Blood pressure 117/74, pulse 77, temperature 98.1 F (36.7 C), temperature source Oral, resp. rate 18, height  5' 4.17" (1.63 m), weight 65.8 kg (145 lb), SpO2 97 %.Body mass index is 24.76 kg/m.    Have you used any form of tobacco in the last 30 days? (Cigarettes, Smokeless Tobacco, Cigars, and/or Pipes): Yes  Has this patient used any form of tobacco in the last 30 days? (Cigarettes, Smokeless Tobacco, Cigars, and/or Pipes) Yes, N/A  Blood Alcohol level:  No results found for: [facility]  Metabolic Disorder Labs:  No results found for: HGBA1C, MPG No results found for: PROLACTIN Lab Results  Component Value Date   CHOL 171 04/16/2016   TRIG 142 04/16/2016   HDL 32 (L) 04/16/2016   CHOLHDL 5.3 04/16/2016   VLDL 28 04/16/2016   LDLCALC 111 (H) 04/16/2016    See Psychiatric Specialty Exam and Suicide Risk Assessment completed by Attending Physician prior to discharge.  Discharge destination:  Home  Is patient on multiple antipsychotic therapies at discharge:  No   Has Patient had three or more failed trials of antipsychotic monotherapy by history:  No  Recommended Plan for Multiple Antipsychotic Therapies: NA     Medication List    STOP taking these medications   acetaminophen 325 MG tablet Commonly known as:  TYLENOL   fluticasone 50 MCG/ACT nasal spray Commonly known as:  FLONASE   HYDROcodone-acetaminophen 5-325 MG tablet Commonly known as:  NORCO/VICODIN   loratadine 10 MG tablet Commonly known as:  CLARITIN     TAKE these medications     Indication  hydrOXYzine 50  MG tablet Commonly known as:  ATARAX/VISTARIL Take 1 tablet (50 mg total) by mouth at bedtime as needed (for sleep).  Indication:  Anxiety Neurosis   nicotine 21 mg/24hr patch Commonly known as:  NICODERM CQ - dosed in mg/24 hours Place 1 patch (21 mg total) onto the skin daily. Start taking on:  04/22/2016  Indication:  Nicotine Addiction      Follow-up Information    [facility]. Go in 1 day(s).   Why:  Patient will admit to this facility for continued treatment today, faciilty will transport. Contact  information: [address] [place]  [address] Phone:  [phone] Fax:  [phone]          Follow-up recommendations:  Activity:  as tol Diet:  as tol  Comments:  1.  Take all your medications as prescribed.   2.  Report any adverse side effects to outpatient provider. 3.  Patient instructed to not use alcohol or illegal drugs while on prescription medicines. 4.  In the event of worsening symptoms, instructed patient to call 911, the crisis hotline or go to nearest emergency room for evaluation of symptoms.  Signed: Lindwood QuaSheila May Tadd Holtmeyer, NP [facility] 04/21/2016, 1:28 PM

## 2016-04-21 NOTE — [facility] Group Notes (Signed)
[facility] Group Notes:  (Nursing/MHT/Case Management/Adjunct)  Date:  04/21/2016  Time:  9:49 AM  Type of Therapy:  Psychoeducational Skills  Participation Level:  Did Not Attend  Participation Quality:  N/A  Affect:  N/A  Cognitive:  N/A  Insight:  None  Engagement in Group:  None  Modes of Intervention:  N/A  Summary of Progress/Problems: Patient was invited but did not attend.   Kenroy Timberman E 04/21/2016, 9:49 AM 

## 2016-04-21 NOTE — Progress Notes (Signed)
Patient ID: Harold Galvan, male   DOB: 1960-12-26, 55 y.o.   MRN: [id number]  Pt. Denies SI/HI and A/V hallucinations. Belongings returned to patient at time of discharge. Patient denies any pain or discomfort. Discharge instructions and medications were reviewed with patient. Patient verbalized understanding of both medications and discharge instructions. Patient [facility] lobby [facility] [name]. He left in no apparent distress. Q15 minute safety checks maintained until discharge.

## 2017-02-02 DIAGNOSIS — L03114 Cellulitis of left upper limb: Secondary | ICD-10-CM

## 2017-02-02 DIAGNOSIS — L0291 Cutaneous abscess, unspecified: Secondary | ICD-10-CM

## 2018-04-18 IMAGING — CR DG WRIST COMPLETE 3+V*R*
4 series · 4 of 4 positions shown · non-contrast
Comparison: None in PACs

CLINICAL DATA: Pain and numbness over the right navicular region.
Questionable history of injury.

EXAM:
RIGHT WRIST - COMPLETE 3+ VIEW

[x wrist pa right]
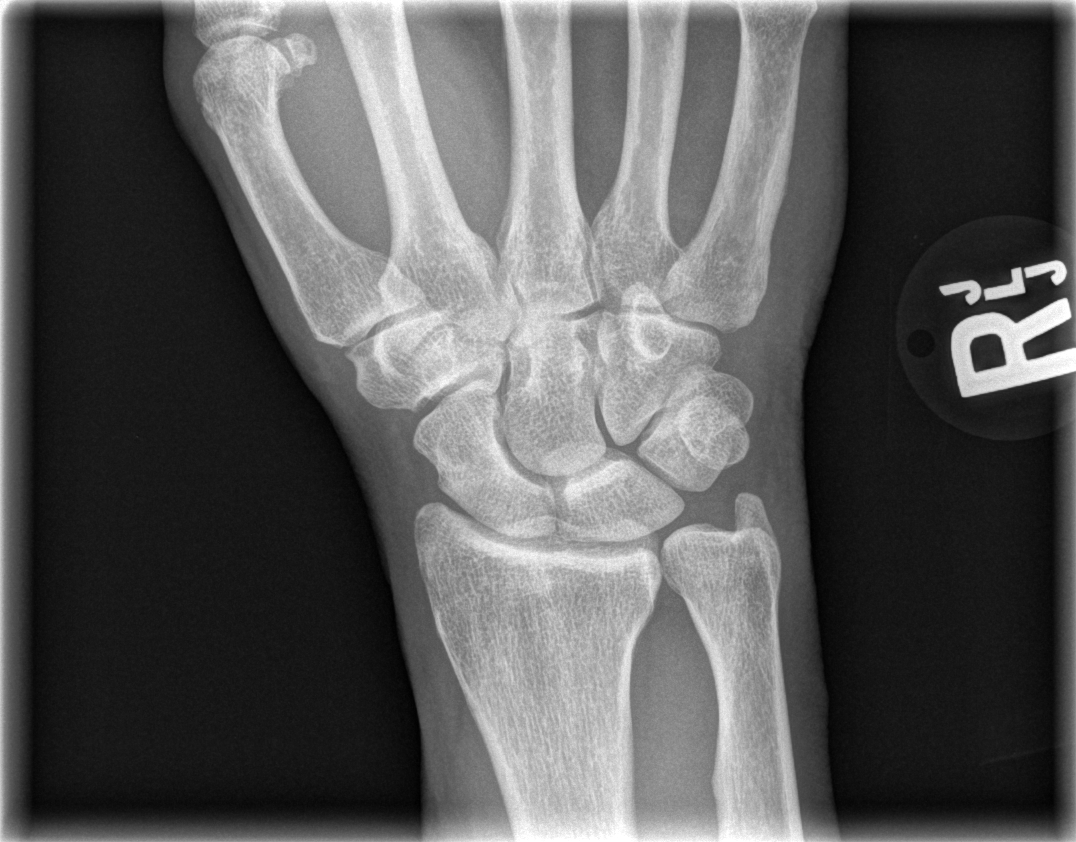

[x wrist obl right]
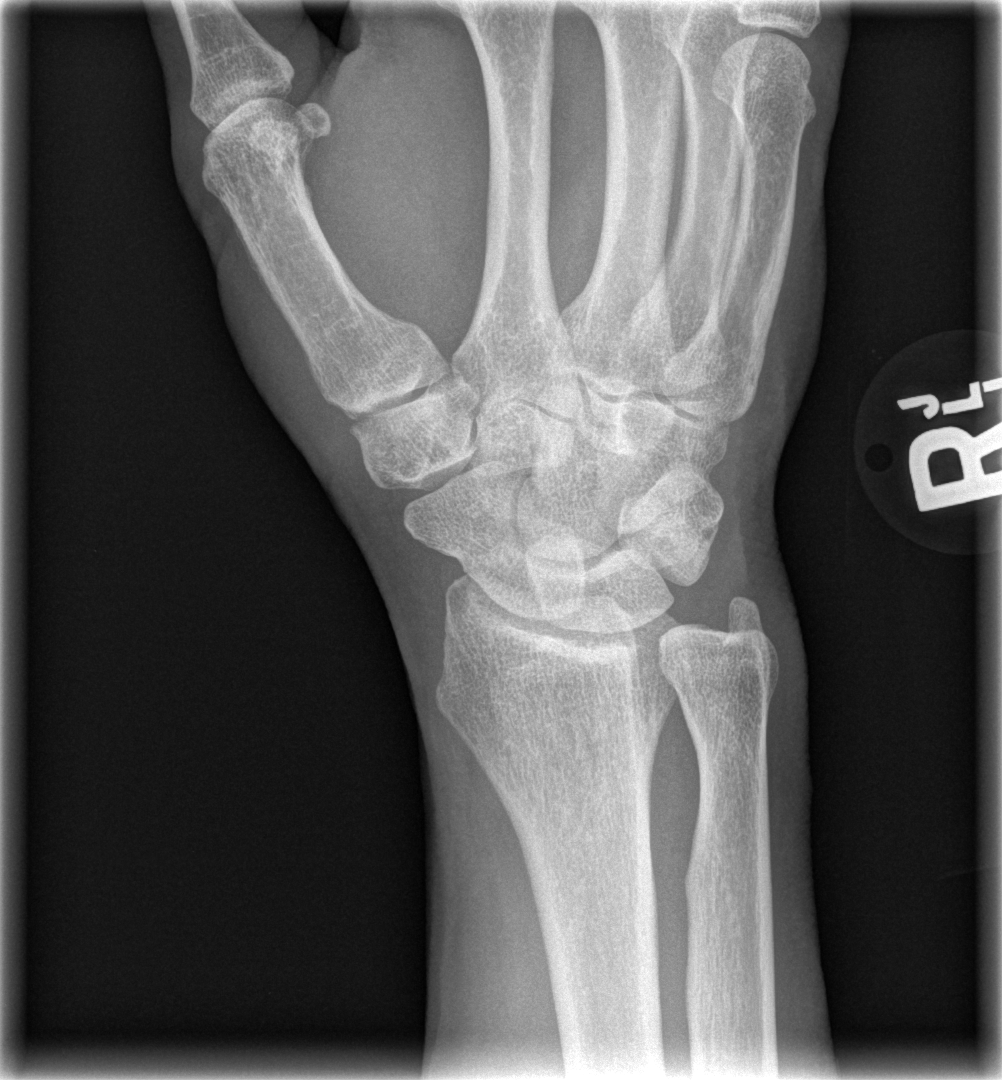

[x wrist lat right]
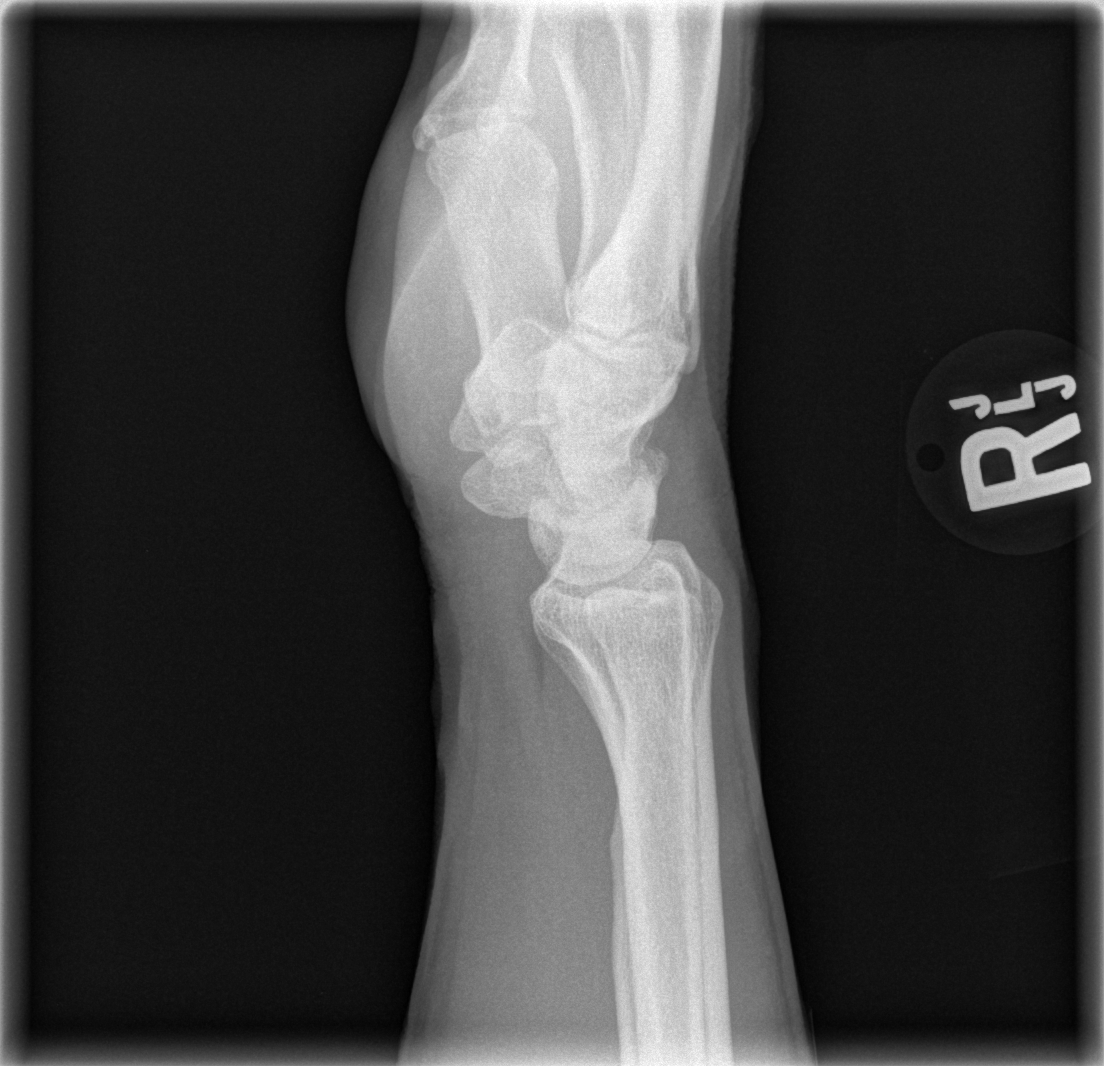

[x wrist navicular]
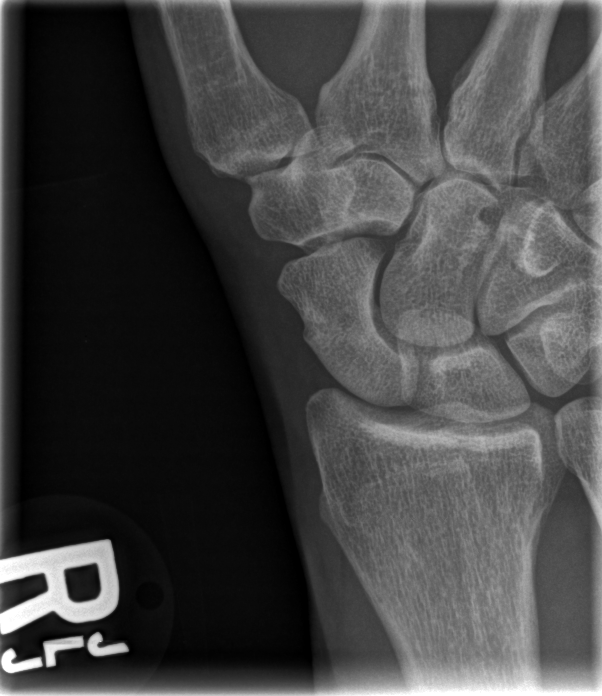

[4 of 4 positions shown; findings below may reference images not displayed]

FINDINGS: The bones are subjectively adequately mineralized. Specific
attention to the navicular or reveals no acute or old fracture.
There is mild narrowing of the first CMC joint. The radiocarpal,
ulnocarpal, and intracarpal joints appear normal. The soft tissues
are unremarkable.
IMPRESSION: There is no acute or significant chronic bony abnormality of the
right wrist.

## 2018-08-11 DEATH — deceased
